# Patient Record
Sex: Female | Born: 1935 | Race: White | Hispanic: No | Marital: Single | Smoking: Former smoker
Health system: Southern US, Community
[De-identification: ages and names within clinical notes are randomized; demographics above are authoritative.]

## PROBLEM LIST (undated history)

## (undated) DIAGNOSIS — I2510 Atherosclerotic heart disease of native coronary artery without angina pectoris: Secondary | ICD-10-CM

## (undated) DIAGNOSIS — Z9889 Other specified postprocedural states: Secondary | ICD-10-CM

## (undated) DIAGNOSIS — E119 Type 2 diabetes mellitus without complications: Secondary | ICD-10-CM

## (undated) DIAGNOSIS — M519 Unspecified thoracic, thoracolumbar and lumbosacral intervertebral disc disorder: Secondary | ICD-10-CM

## (undated) DIAGNOSIS — E785 Hyperlipidemia, unspecified: Secondary | ICD-10-CM

## (undated) DIAGNOSIS — M1990 Unspecified osteoarthritis, unspecified site: Secondary | ICD-10-CM

## (undated) DIAGNOSIS — R011 Cardiac murmur, unspecified: Secondary | ICD-10-CM

## (undated) DIAGNOSIS — I10 Essential (primary) hypertension: Secondary | ICD-10-CM

## (undated) DIAGNOSIS — E039 Hypothyroidism, unspecified: Secondary | ICD-10-CM

## (undated) DIAGNOSIS — E669 Obesity, unspecified: Secondary | ICD-10-CM

## (undated) DIAGNOSIS — R0602 Shortness of breath: Secondary | ICD-10-CM

## (undated) DIAGNOSIS — I480 Paroxysmal atrial fibrillation: Secondary | ICD-10-CM

## (undated) DIAGNOSIS — G5600 Carpal tunnel syndrome, unspecified upper limb: Secondary | ICD-10-CM

## (undated) DIAGNOSIS — Z7901 Long term (current) use of anticoagulants: Secondary | ICD-10-CM

## (undated) DIAGNOSIS — I119 Hypertensive heart disease without heart failure: Secondary | ICD-10-CM

## (undated) DIAGNOSIS — I350 Nonrheumatic aortic (valve) stenosis: Secondary | ICD-10-CM

## (undated) DIAGNOSIS — Z8719 Personal history of other diseases of the digestive system: Secondary | ICD-10-CM

## (undated) HISTORY — DX: Unspecified thoracic, thoracolumbar and lumbosacral intervertebral disc disorder: M51.9

## (undated) HISTORY — DX: Nonrheumatic aortic (valve) stenosis: I35.0

## (undated) HISTORY — PX: JOINT REPLACEMENT: SHX530

## (undated) HISTORY — PX: CERVICAL FUSION: SHX112

## (undated) HISTORY — PX: CHOLECYSTECTOMY: SHX55

## (undated) HISTORY — DX: Paroxysmal atrial fibrillation: I48.0

## (undated) HISTORY — PX: TONSILLECTOMY: SUR1361

## (undated) HISTORY — PX: LUMBAR LAMINECTOMY: SHX95

## (undated) HISTORY — DX: Hypothyroidism, unspecified: E03.9

## (undated) HISTORY — PX: REPLACEMENT TOTAL KNEE: SUR1224

## (undated) HISTORY — DX: Carpal tunnel syndrome, unspecified upper limb: G56.00

## (undated) HISTORY — DX: Obesity, unspecified: E66.9

## (undated) HISTORY — DX: Hyperlipidemia, unspecified: E78.5

## (undated) HISTORY — PX: ABDOMINAL HYSTERECTOMY: SHX81

## (undated) HISTORY — PX: CARPAL TUNNEL RELEASE: SHX101

## (undated) HISTORY — DX: Shortness of breath: R06.02

## (undated) HISTORY — DX: Personal history of other diseases of the digestive system: Z87.19

## (undated) HISTORY — PX: BREAST BIOPSY: SHX20

## (undated) HISTORY — DX: Other specified postprocedural states: Z98.890

## (undated) HISTORY — PX: HERNIA REPAIR: SHX51

## (undated) HISTORY — DX: Long term (current) use of anticoagulants: Z79.01

## (undated) HISTORY — PX: EYE SURGERY: SHX253

## (undated) HISTORY — DX: Hypertensive heart disease without heart failure: I11.9

---

## 2008-05-30 ENCOUNTER — Inpatient Hospital Stay: Payer: Self-pay | Admitting: Surgery

## 2015-01-17 DIAGNOSIS — I4891 Unspecified atrial fibrillation: Secondary | ICD-10-CM | POA: Insufficient documentation

## 2017-05-18 ENCOUNTER — Encounter: Payer: Self-pay | Admitting: Cardiology

## 2017-05-18 NOTE — Progress Notes (Unsigned)
Kristina Morrison, Analilia H  Date of visit:  05/18/2017 DOB:  [birth year]    Age:  83 yrs. Medical record number:  [id number]     Account number:  [id number] Primary Care Provider: CASANOVA,LUIS ____________________________ CURRENT DIAGNOSES  1. Shortness of breath  2. Aortic valve stenosis  3. Hypertensive heart disease without heart failure  4. Paroxysmal atrial fibrillation  5. Long term (current) use of anticoagulants  6. Hyperlipidemia  7. Hypothyroidism  8. Obesity ____________________________ ALLERGIES  Penicillins, Intolerance-unknown  Sulfa (Sulfonamide Antibiotics), Intolerance-unknown ____________________________ MEDICATIONS  1. atorvastatin 20 mg tablet, 1/2 tab daily  2. carvedilol 12.5 mg tablet, BID  3. Cinnamon 500 mg capsule, BID  4. Eliquis 5 mg tablet, BID  5. Fish Oil 1,000 mg (120 mg-180 mg) capsule, BID  6. folic acid 400 mcg tablet, 1 p.o. daily  7. fosinopril 20 mg tablet, 1 p.o. daily  8. levothyroxine 50 mcg tablet, 1 p.o. daily  9. Multaq 400 mg tablet, BID  10. Multivitamins Tab, 1 p.o. daily  11. Vitamin C 500 mg tablet, BID  12. Vitamin D3 1,000 unit capsule, 1 p.o. daily ____________________________ HISTORY OF PRESENT ILLNESS  Patient is seen for evaluation of the severity of aortic stenosis and for dyspnea. The patient has a history of hypertension and diabetes. 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 She still lives independently and still drives. She has progressively limited with back pain and knee pain and has difficulty walking because of her previous back issues. She is receiving injections for her back. She had noticed issues with increasing dyspnea recently when she would walk up a hill with her driveway taking her trash cans back up. For the most part when she walks however she is limited with back pain and not with dyspnea. She finds it difficult to stand and has to walk with a cane. She does not have  angina and does not have PND, orthopnea or edema. She was seen recently by a cardiologist in [place] who told her that she needs to have her valve put in and he would give her another 10 years of life. Report of the echo reportedly showed a peak velocity of 3.7 m/s across the aortic valve. The patient does not really have much in the way of edema. She has lost about 25 pounds of weight since she was here. She has a history of paroxysmal atrial fibrillation but is in normal sinus rhythm here being treated with Multaq. She is a long-term anticoagulation with Eliquis and tolerates it well without bleeding side effects or complications. She also has recently been diagnosed with hypothyroidism and is on replacement therapy for this. Because of orthostatic hypotension a previous dose of amlodipine was stopped and she reports that she has not been checking her blood pressure since then. The dyspnea may have improved somewhat since then. ____________________________ PAST HISTORY  Past Medical Illnesses:  hypertension, DM-non-insulin dependent, hyperlipidemia, obesity, carpal tunnel syndrome, lumbar disc disease, hernia repair, hypothyroidism;  Cardiovascular Illnesses:  atrial fibrillation;  Surgical Procedures:  breast biopsy, carpal tunnel release, cholecystectomy, hysterectomy, knee replacement-bil, laminectomy lumbar, cervical spine fusion;  NYHA Classification:  II;  Canadian Angina Classification:  Class 0: Asymptomatic;  Cardiology Procedures-Invasive:  no history of prior cardiac procedures;  Cardiology Procedures-Noninvasive:  echocardiogram June 2011, lexiscan cardiolite June 2011, echocardiogram May 2015;  LVEF of 60% documented via echocardiogram on 05/18/2017,   ____________________________ CARDIO-PULMONARY TEST DATES EKG Date:  05/18/2017;  Nuclear Study Date:  12/13/2009;  Echocardiography Date: 05/18/2017  ____________________________ FAMILY HISTORY Father -- Father dead, Heart Attack Mother --  Mother dead, CVA Sister -- Sister dead, Carcinoma of breast ____________________________ SOCIAL HISTORY Alcohol Use:  rare;  Smoking:  used to smoke but quit, 1960's;  Diet:  regular diet;  Lifestyle:  widowed;  Exercise:  no regular exercise;  Occupation:  homemaker;  Illicit Drug Use:  denies substance abuse;  Residence:  lives alone;   ____________________________ REVIEW OF SYSTEMS General:  obesity, malaise and fatigue  Integumentary:no rashes or new skin lesions. Eyes: wears eye glasses/contact lenses, cataract extraction bilaterally, denies diplopia, glaucoma or visual field defects. Ears, Nose, Throat, Mouth:  partial hearing loss, hearing aides Respiratory: dyspnea with exertion Cardiovascular:  please review HPI Abdominal: occasional diarrheaGenitourinary-Female: nocturia Musculoskeletal:  arthritis of the knees, chronic back pain Neurological:  denies headaches, stroke, or TIA Psychiatric:  denies depression or anxiety Hematological/Immunologic:  denies any food allergies, bleeding disorders. ____________________________ PHYSICAL EXAMINATION VITAL SIGNS  Blood Pressure:  160/84 Sitting, Left arm, large cuff  , 170/82 Standing, Left arm and large cuff   Pulse:  60/min. Weight:  196.00 lbs. Height:  64"BMI: 33  Constitutional:  pleasant white female, in no acute distress, moderately obese Skin:  warm and dry to touch, no apparent skin lesions, or masses noted. Head:  normocephalic, normal hair pattern, no masses or tenderness Eyes:  EOMS Intact, PERRLA, C and S clear, Funduscopic exam not done. ENT:  bilateral hearing aides present Neck:  supple, without massess. No JVD, thyromegaly or carotid bruits. Carotid upstroke normal. Chest:  normal symmetry, clear to auscultation. Cardiac:  regular rhythm, normal S1 and S2, no S3 or S4, grade 2/6 systolic murmur at aortic area radiating to neck Abdomen:  abdomen soft,non-tender, no masses, no hepatospenomegaly, or aneurysm noted Peripheral  Pulses:  the femoral,dorsalis pedis, and posterior tibial pulses are full and equal bilaterally with no bruits auscultated. Extremities & Back:  scar from knee surgery bilaterally, no spinal abnormalities noted., no edema present Psychiatric:  oriented to time, place, and person, no difficulties with speech or language Neurological:  no gross motor or sensory deficits noted, affect appropriate, oriented x3. ____________________________ MOST RECENT LIPID PANEL 09/15/13  CHOL TOTL 126 mg/dl, LDL 51 NM, HDL 58 mg/dl, TRIGLYCER 86 mg/dl and CHOL/HDL 2.2 (Calc) ____________________________ IMPRESSIONS/PLAN  1. Moderate to severe aortic stenosis with minimal symptoms 2. Severe low back pain and osteoarthritis 3. Hypertensive heart disease currently above goal 4. Obesity with 25 pound weight loss 5. Paroxysmal atrial fibrillation currently in sinus rhythm 6. Long-term use of anticoagulation without complications 7. Diabetes mellitus without complications  Recommendations:  Long discussion with patient and daughter about aortic stenosis. After careful history her limiting symptoms there appeared to be more back and knee pain rather than dyspnea. Although she does have some dyspnea her aortic valve area is only moderate to severe and her peak gradient in R. lab was 2.7 m/s and valve opening was still present on the visual images. She has had some intermittent dyspnea through the years with much lesser gradients. 12-lead EKG personally reviewed by me shows sinus bradycardia with left axis deviation, QS pattern in V2. I reviewed old records from her previous physician and her old chart here.  At this point in time I believe her limiting symptoms are more orthopedic than cardiac. She is quite happy with her quality of life and is 83 years old. Her aortic annulus appears to be somewhat on the  small side and unless she was more limited with dyspnea I would favor continued observation at this point with a  followup echo in 6 months. If she was highly symptomatic in terms of dyspnea that I believe it would be reasonable to pursue TAVR.  I will discuss the case and have the echo reviewed by Dr. Tonny BollmanMichael Cooper. At this point in time I would recommend that she monitor her blood pressures at home and restart the amlodipine if the pressure remains high. Followup echo in 6 months.   Greater than 50 minutes spent with patient as well as her daughter including greater than 50% face to face including counseling/coordination of care, consultation with other physicians as well as review of previous records and echo concerning aortic stenosis, exercise intolerance, hypertensive heart disease and paroxysmal atrial fibrillation. ____________________________ TODAYS ORDERS  1. 12 Lead EKG: Today  2. ECHO 6 months.                        ____________________________ Cardiology Physician:  Darden PalmerW. Spencer Breean Nannini, Jr. MD [facility]

## 2017-05-20 ENCOUNTER — Institutional Professional Consult (permissible substitution): Payer: Self-pay | Admitting: Cardiovascular Disease

## 2017-08-05 ENCOUNTER — Encounter: Payer: Self-pay | Admitting: Cardiovascular Disease

## 2017-08-05 ENCOUNTER — Other Ambulatory Visit: Payer: Self-pay

## 2017-08-05 DIAGNOSIS — I350 Nonrheumatic aortic (valve) stenosis: Secondary | ICD-10-CM

## 2017-08-05 NOTE — H&P (View-Only) (Signed)
Cardiology Office Note Date:  08/06/2017   ID:  Kristina Morrison, DOB 01/24/1925, MRN [id number]  PCP:  Marsa Arisasanova, Luis  Cardiologist:  Viann FishSpencer Tilley, MD  Chief Complaint  Patient presents with  . Shortness of Breath     History of Present Illness: Kristina Morrison is a 84 y.o. female who presents for evaluation of aortic stenosis at the request of Dr Donnie Ahoilley.  The patient lives in [place] and is the mother of Dr York Spanielilley's nurse. She has been functionally independent. The patient has been followed for paroxysmal atrial fibrillation and aortic stenosis. She is managed with a rhythm control strategy using multaq and is anticoagulated with apixaban.   The patient is here with her daughter today. She has been told of a heart murmur for about 40 years. When she was seen by her cardiologist in [place] this past Fall, he recommend that she consider TAVR because she had complained of shortness of breath with walking up her driveway. Since her daughter has worked with Dr Donnie Ahoilley, she [facility] her [facility] [place]. Because she was primarily limited by osteoarthritis at that point, he recommended continued surveillance. Since then, she has returned to [place] and has noted progressive shortness of breath, now occurring with even low-level activity such as walking on flat ground or moving around in the house. She also complains of a fullness in her left chest with activity. She is independent but significantly limited by severe osteoarthritis in her knees and back. She has undergone bilateral total knee replacement. No orthopnea or PND. No leg swelling. Also complains of a 'cold sweat' when she is active.  The patient also complains of lightheadedness.  She had blood in her stool recently and underwent colonoscopy and this was felt secondary to diverticulosis. This has now resolved. She has been treated with apixaban and she does not take aspirin products.   Past Medical History:  Diagnosis  Date  . Aortic valve stenosis   . Carpal tunnel syndrome   . H/O hernia repair   . Hyperlipidemia   . Hypertensive heart disease without heart failure   . Hypothyroidism   . Long term (current) use of anticoagulants   . Lumbar disc disease   . Obesity   . Paroxysmal atrial fibrillation (HCC)   . SOB (shortness of breath)     Past Surgical History:  Procedure Laterality Date  . ABDOMINAL HYSTERECTOMY    . BREAST BIOPSY    . CARPAL TUNNEL RELEASE    . CERVICAL FUSION    . CHOLECYSTECTOMY    . LUMBAR LAMINECTOMY    . REPLACEMENT TOTAL KNEE Bilateral     Current Outpatient Medications  Medication Sig Dispense Refill  . apixaban (ELIQUIS) 5 MG TABS tablet Take 5 mg by mouth 2 (two) times daily.    Marland Kitchen. atorvastatin (LIPITOR) 20 MG tablet Take 20 mg by mouth daily.     . carvedilol (COREG) 25 MG tablet Take 25 mg by mouth daily.     . Cholecalciferol (VITAMIN D3) 1000 units CAPS Take 1 capsule by mouth daily.    . Cinnamon 500 MG capsule Take 500 mg by mouth 2 (two) times daily.    Marland Kitchen. dronedarone (MULTAQ) 400 MG tablet Take 400 mg by mouth 2 (two) times daily with a meal.    . famotidine (PEPCID) 20 MG tablet Take 20 mg by mouth daily.    . folic acid (FOLVITE) 400 MCG tablet Take 400 mcg by mouth daily.    .Marland Kitchen  fosinopril (MONOPRIL) 20 MG tablet Take 20 mg by mouth daily.    Marland Kitchen glimepiride (AMARYL) 1 MG tablet Take 1 tablet by mouth daily.  0  . levothyroxine (SYNTHROID, LEVOTHROID) 50 MCG tablet Take 50 mcg by mouth daily before breakfast.    . Multiple Vitamin (MULTIVITAMIN) tablet Take 1 tablet by mouth daily.    . Omega-3 Fatty Acids (FISH OIL) 1000 MG CAPS Take 1 capsule by mouth 2 (two) times daily.    . predniSONE (DELTASONE) 10 MG tablet Take 0.5 tablets by mouth daily.  2  . vitamin C (ASCORBIC ACID) 500 MG tablet Take 500 mg by mouth 2 (two) times daily.     No current facility-administered medications for this visit.     Allergies:   Other; Penicillins; and Sulfa  antibiotics   Social History:  The patient  reports that she quit smoking about 59 years ago. she has never used smokeless tobacco. She reports that she drinks alcohol. She reports that she does not use drugs.   Family History:  The patient's family history includes Breast cancer in her sister; CVA in her mother; Heart attack in her father.    ROS:  Please see the history of present illness.  Otherwise, review of systems is positive for hearing loss, easy bruising, hearing loss.  All other systems are reviewed and negative.    PHYSICAL EXAM: VS:  BP 126/78   Pulse (!) 59   Ht 5\' 5"  (1.651 m)   Wt 192 lb 9.6 oz (87.4 kg)   SpO2 98%   BMI 32.05 kg/m  , BMI Body mass index is 32.05 kg/m. GEN: Well nourished, well developed, pleasant elderly woman in no acute distress  HEENT: normal  Neck: no JVD, no masses. bilateral carotid bruits Cardiac: RRR with 3/6 late peaking harsh crescendo decrescendo murmur at the right upper sternal border, A2 is audible but diminished.               Respiratory:  clear to auscultation bilaterally, normal work of breathing GI: soft, nontender, nondistended, + BS MS: no deformity or atrophy  Ext: trace bilateral pretibial edema, pedal pulses 2+= bilaterally Skin: warm and dry, no rash Neuro:  Strength and sensation are intact Psych: euthymic mood, full affect  EKG:  EKG is ordered today. EKG demonstrates sinus rhythm 59 bpm, first-degree AV block with PR interval 220 ms, left anterior fascicular block. Recent Labs: No results found for requested labs within last 8760 hours.   Lipid Panel  No results found for: CHOL, TRIG, HDL, CHOLHDL, VLDL, LDLCALC, LDLDIRECT    Wt Readings from Last 3 Encounters:  08/06/17 192 lb 9.6 oz (87.4 kg)     Cardiac Studies Reviewed: Echo pending  ASSESSMENT AND PLAN: 84 year old woman with suspected severe, stage D1, symptomatic aortic stenosis.  The patient's physical exam and symptoms are very typical of  symptomatic aortic stenosis.  By report, the peak transaortic velocity has been in the range of 3.7 m/s.  A subsequent echo study in November demonstrated a peak velocity of 2.7 m/s.  With such typical symptoms and exam findings, will update her echocardiogram as I think she may have progressive and now severe aortic stenosis.  I have reviewed the natural history of aortic stenosis with the patient and her daughter who is present today. We have discussed the limitations of medical therapy and the poor prognosis associated with symptomatic aortic stenosis. We have reviewed potential treatment options, including palliative medical therapy, conventional surgical aortic  valve replacement, and transcatheter aortic valve replacement. We discussed treatment options in the context of the patient's specific comorbid medical conditions.   We discussed the typical evaluation of treatment options specific to TAVR considering this patient's advanced age of 84 years old.  I have recommended an updated surface echocardiogram, a right and left heart catheterization, and pending the catheterization findings I would anticipate CTA studies of the heart and peripheral vessels.  Would then likely proceed with formal cardiac surgical consultation.  The patient will be advised to hold apixaban for 48 hours prior to cardiac catheterization. I have reviewed the risks, indications, and alternatives to cardiac catheterization, possible angioplasty, and stenting with the patient. Risks include but are not limited to bleeding, infection, vascular injury, stroke, myocardial infection, arrhythmia, kidney injury, radiation-related injury in the case of prolonged fluoroscopy use, emergency cardiac surgery, and death. The patient understands the risks of serious complication is 1-2 in 1000 with diagnostic cardiac cath and 1-2% or less with angioplasty/stenting.   Current medicines are reviewed with the patient today.  The patient does not  have concerns regarding medicines.  Labs/ tests ordered today include:  No orders of the defined types were placed in this encounter.    Signed, Tonny Bollman, MD  08/06/2017 11:45 AM    [facility] [address] [place], [place], [place]  [address] Phone: [phone]; Fax: [phone]

## 2017-08-05 NOTE — H&P (View-Only) (Signed)
Cardiology Office Note Date:  08/06/2017   ID:  Kristina FlurryBetty H Kincy, DOB 01/24/1925, MRN [id number]  PCP:  Marsa Arisasanova, Luis  Cardiologist:  Viann FishSpencer Tilley, MD  Chief Complaint  Patient presents with  . Shortness of Breath     History of Present Illness: Kristina Morrison is a 84 y.o. female who presents for evaluation of aortic stenosis at the request of Dr Donnie Ahoilley.  The patient lives in [place] and is the mother of Dr York Spanielilley's nurse. She has been functionally independent. The patient has been followed for paroxysmal atrial fibrillation and aortic stenosis. She is managed with a rhythm control strategy using multaq and is anticoagulated with apixaban.   The patient is here with her daughter today. She has been told of a heart murmur for about 40 years. When she was seen by her cardiologist in [place] this past Fall, he recommend that she consider TAVR because she had complained of shortness of breath with walking up her driveway. Since her daughter has worked with Dr Donnie Ahoilley, she had her mom come [facility]. Because she was primarily limited by osteoarthritis at that point, he recommended continued surveillance. Since then, she has returned to [place] and has noted progressive shortness of breath, now occurring with even low-level activity such as walking on flat ground or moving around in the house. She also complains of a fullness in her left chest with activity. She is independent but significantly limited by severe osteoarthritis in her knees and back. She has undergone bilateral total knee replacement. No orthopnea or PND. No leg swelling. Also complains of a 'cold sweat' when she is active.  The patient also complains of lightheadedness.  She had blood in her stool recently and underwent colonoscopy and this was felt secondary to diverticulosis. This has now resolved. She has been treated with apixaban and she does not take aspirin products.   Past Medical History:  Diagnosis  Date  . Aortic valve stenosis   . Carpal tunnel syndrome   . H/O hernia repair   . Hyperlipidemia   . Hypertensive heart disease without heart failure   . Hypothyroidism   . Long term (current) use of anticoagulants   . Lumbar disc disease   . Obesity   . Paroxysmal atrial fibrillation (HCC)   . SOB (shortness of breath)     Past Surgical History:  Procedure Laterality Date  . ABDOMINAL HYSTERECTOMY    . BREAST BIOPSY    . CARPAL TUNNEL RELEASE    . CERVICAL FUSION    . CHOLECYSTECTOMY    . LUMBAR LAMINECTOMY    . REPLACEMENT TOTAL KNEE Bilateral     Current Outpatient Medications  Medication Sig Dispense Refill  . apixaban (ELIQUIS) 5 MG TABS tablet Take 5 mg by mouth 2 (two) times daily.    Marland Kitchen. atorvastatin (LIPITOR) 20 MG tablet Take 20 mg by mouth daily.     . carvedilol (COREG) 25 MG tablet Take 25 mg by mouth daily.     . Cholecalciferol (VITAMIN D3) 1000 units CAPS Take 1 capsule by mouth daily.    . Cinnamon 500 MG capsule Take 500 mg by mouth 2 (two) times daily.    Marland Kitchen. dronedarone (MULTAQ) 400 MG tablet Take 400 mg by mouth 2 (two) times daily with a meal.    . famotidine (PEPCID) 20 MG tablet Take 20 mg by mouth daily.    . folic acid (FOLVITE) 400 MCG tablet Take 400 mcg by mouth daily.    .Marland Kitchen  fosinopril (MONOPRIL) 20 MG tablet Take 20 mg by mouth daily.    Marland Kitchen glimepiride (AMARYL) 1 MG tablet Take 1 tablet by mouth daily.  0  . levothyroxine (SYNTHROID, LEVOTHROID) 50 MCG tablet Take 50 mcg by mouth daily before breakfast.    . Multiple Vitamin (MULTIVITAMIN) tablet Take 1 tablet by mouth daily.    . Omega-3 Fatty Acids (FISH OIL) 1000 MG CAPS Take 1 capsule by mouth 2 (two) times daily.    . predniSONE (DELTASONE) 10 MG tablet Take 0.5 tablets by mouth daily.  2  . vitamin C (ASCORBIC ACID) 500 MG tablet Take 500 mg by mouth 2 (two) times daily.     No current facility-administered medications for this visit.     Allergies:   Other; Penicillins; and Sulfa  antibiotics   Social History:  The patient  reports that she quit smoking about 59 years ago. she has never used smokeless tobacco. She reports that she drinks alcohol. She reports that she does not use drugs.   Family History:  The patient's family history includes Breast cancer in her sister; CVA in her mother; Heart attack in her father.    ROS:  Please see the history of present illness.  Otherwise, review of systems is positive for hearing loss, easy bruising, hearing loss.  All other systems are reviewed and negative.    PHYSICAL EXAM: VS:  BP 126/78   Pulse (!) 59   Ht 5\' 5"  (1.651 m)   Wt 192 lb 9.6 oz (87.4 kg)   SpO2 98%   BMI 32.05 kg/m  , BMI Body mass index is 32.05 kg/m. GEN: Well nourished, well developed, pleasant elderly woman in no acute distress  HEENT: normal  Neck: no JVD, no masses. bilateral carotid bruits Cardiac: RRR with 3/6 late peaking harsh crescendo decrescendo murmur at the right upper sternal border, A2 is audible but diminished.               Respiratory:  clear to auscultation bilaterally, normal work of breathing GI: soft, nontender, nondistended, + BS MS: no deformity or atrophy  Ext: trace bilateral pretibial edema, pedal pulses 2+= bilaterally Skin: warm and dry, no rash Neuro:  Strength and sensation are intact Psych: euthymic mood, full affect  EKG:  EKG is ordered today. EKG demonstrates sinus rhythm 59 bpm, first-degree AV block with PR interval 220 ms, left anterior fascicular block. Recent Labs: No results found for requested labs within last 8760 hours.   Lipid Panel  No results found for: CHOL, TRIG, HDL, CHOLHDL, VLDL, LDLCALC, LDLDIRECT    Wt Readings from Last 3 Encounters:  08/06/17 192 lb 9.6 oz (87.4 kg)     Cardiac Studies Reviewed: Echo pending  ASSESSMENT AND PLAN: 84 year old woman with suspected severe, stage D1, symptomatic aortic stenosis.  The patient's physical exam and symptoms are very typical of  symptomatic aortic stenosis.  By report, the peak transaortic velocity has been in the range of 3.7 m/s.  A subsequent echo study in November demonstrated a peak velocity of 2.7 m/s.  With such typical symptoms and exam findings, will update her echocardiogram as I think she may have progressive and now severe aortic stenosis.  I have reviewed the natural history of aortic stenosis with the patient and her daughter who is present today. We have discussed the limitations of medical therapy and the poor prognosis associated with symptomatic aortic stenosis. We have reviewed potential treatment options, including palliative medical therapy, conventional surgical aortic  valve replacement, and transcatheter aortic valve replacement. We discussed treatment options in the context of the patient's specific comorbid medical conditions.   We discussed the typical evaluation of treatment options specific to TAVR considering this patient's advanced age of 84 years old.  I have recommended an updated surface echocardiogram, a right and left heart catheterization, and pending the catheterization findings I would anticipate CTA studies of the heart and peripheral vessels.  Would then likely proceed with formal cardiac surgical consultation.  The patient will be advised to hold apixaban for 48 hours prior to cardiac catheterization. I have reviewed the risks, indications, and alternatives to cardiac catheterization, possible angioplasty, and stenting with the patient. Risks include but are not limited to bleeding, infection, vascular injury, stroke, myocardial infection, arrhythmia, kidney injury, radiation-related injury in the case of prolonged fluoroscopy use, emergency cardiac surgery, and death. The patient understands the risks of serious complication is 1-2 in 1000 with diagnostic cardiac cath and 1-2% or less with angioplasty/stenting.   Current medicines are reviewed with the patient today.  The patient does not  have concerns regarding medicines.  Labs/ tests ordered today include:  No orders of the defined types were placed in this encounter.    Signed, Tonny Bollman, MD  08/06/2017 11:45 AM    [facility] [address] [place], [place], [place]  [address] Phone: [phone]; Fax: [phone]

## 2017-08-05 NOTE — Progress Notes (Signed)
Cardiology Office Note Date:  08/06/2017   ID:  Kristina Morrison, DOB 01/24/1925, MRN [id number]  PCP:  Kristina Morrison  Cardiologist:  Kristina FishSpencer Tilley, MD  Chief Complaint  Patient presents with  . Shortness of Breath     History of Present Illness: Kristina Morrison is a 84 y.o. female who presents for evaluation of aortic stenosis at the request of Dr Donnie Ahoilley.  The patient lives in [place] and is the mother of Dr York Spanielilley's nurse. She has been functionally independent. The patient has been followed for paroxysmal atrial fibrillation and aortic stenosis. She is managed with a rhythm control strategy using multaq and is anticoagulated with apixaban.   The patient is here with her daughter today. She has been told of a heart murmur for about 40 years. When she was seen by her cardiologist in [place] this past Fall, he recommend that she consider TAVR because she had complained of shortness of breath with walking up her driveway. Since her daughter has worked with Dr Donnie Ahoilley, she had her mom come to [facility] for evaluation. Because she was primarily limited by osteoarthritis at that point, he recommended continued surveillance. Since then, she has returned to [place] and has noted progressive shortness of breath, now occurring with even low-level activity such as walking on flat ground or moving around in the house. She also complains of a fullness in her left chest with activity. She is independent but significantly limited by severe osteoarthritis in her knees and back. She has undergone bilateral total knee replacement. No orthopnea or PND. No leg swelling. Also complains of a 'cold sweat' when she is active.  The patient also complains of lightheadedness.  She had blood in her stool recently and underwent colonoscopy and this was felt secondary to diverticulosis. This has now resolved. She has been treated with apixaban and she does not take aspirin products.   Past Medical History:  Diagnosis  Date  . Aortic valve stenosis   . Carpal tunnel syndrome   . H/O hernia repair   . Hyperlipidemia   . Hypertensive heart disease without heart failure   . Hypothyroidism   . Long term (current) use of anticoagulants   . Lumbar disc disease   . Obesity   . Paroxysmal atrial fibrillation (HCC)   . SOB (shortness of breath)     Past Surgical History:  Procedure Laterality Date  . ABDOMINAL HYSTERECTOMY    . BREAST BIOPSY    . CARPAL TUNNEL RELEASE    . CERVICAL FUSION    . CHOLECYSTECTOMY    . LUMBAR LAMINECTOMY    . REPLACEMENT TOTAL KNEE Bilateral     Current Outpatient Medications  Medication Sig Dispense Refill  . apixaban (ELIQUIS) 5 MG TABS tablet Take 5 mg by mouth 2 (two) times daily.    Marland Kitchen. atorvastatin (LIPITOR) 20 MG tablet Take 20 mg by mouth daily.     . carvedilol (COREG) 25 MG tablet Take 25 mg by mouth daily.     . Cholecalciferol (VITAMIN D3) 1000 units CAPS Take 1 capsule by mouth daily.    . Cinnamon 500 MG capsule Take 500 mg by mouth 2 (two) times daily.    Marland Kitchen. dronedarone (MULTAQ) 400 MG tablet Take 400 mg by mouth 2 (two) times daily with a meal.    . famotidine (PEPCID) 20 MG tablet Take 20 mg by mouth daily.    . folic acid (FOLVITE) 400 MCG tablet Take 400 mcg by mouth daily.    .Marland Kitchen  fosinopril (MONOPRIL) 20 MG tablet Take 20 mg by mouth daily.    Marland Kitchen glimepiride (AMARYL) 1 MG tablet Take 1 tablet by mouth daily.  0  . levothyroxine (SYNTHROID, LEVOTHROID) 50 MCG tablet Take 50 mcg by mouth daily before breakfast.    . Multiple Vitamin (MULTIVITAMIN) tablet Take 1 tablet by mouth daily.    . Omega-3 Fatty Acids (FISH OIL) 1000 MG CAPS Take 1 capsule by mouth 2 (two) times daily.    . predniSONE (DELTASONE) 10 MG tablet Take 0.5 tablets by mouth daily.  2  . vitamin C (ASCORBIC ACID) 500 MG tablet Take 500 mg by mouth 2 (two) times daily.     No current facility-administered medications for this visit.     Allergies:   Other; Penicillins; and Sulfa  antibiotics   Social History:  The patient  reports that she quit smoking about 59 years ago. she has never used smokeless tobacco. She reports that she drinks alcohol. She reports that she does not use drugs.   Family History:  The patient's family history includes Breast cancer in her sister; CVA in her mother; Heart attack in her father.    ROS:  Please see the history of present illness.  Otherwise, review of systems is positive for hearing loss, easy bruising, hearing loss.  All other systems are reviewed and negative.    PHYSICAL EXAM: VS:  BP 126/78   Pulse (!) 59   Ht 5\' 5"  (1.651 m)   Wt 192 lb 9.6 oz (87.4 kg)   SpO2 98%   BMI 32.05 kg/m  , BMI Body mass index is 32.05 kg/m. GEN: Well nourished, well developed, pleasant elderly woman in no acute distress  HEENT: normal  Neck: no JVD, no masses. bilateral carotid bruits Cardiac: RRR with 3/6 late peaking harsh crescendo decrescendo murmur at the right upper sternal border, A2 is audible but diminished.               Respiratory:  clear to auscultation bilaterally, normal work of breathing GI: soft, nontender, nondistended, + BS MS: no deformity or atrophy  Ext: trace bilateral pretibial edema, pedal pulses 2+= bilaterally Skin: warm and dry, no rash Neuro:  Strength and sensation are intact Psych: euthymic mood, full affect  EKG:  EKG is ordered today. EKG demonstrates sinus rhythm 59 bpm, first-degree AV block with PR interval 220 ms, left anterior fascicular block. Recent Labs: No results found for requested labs within last 8760 hours.   Lipid Panel  No results found for: CHOL, TRIG, HDL, CHOLHDL, VLDL, LDLCALC, LDLDIRECT    Wt Readings from Last 3 Encounters:  08/06/17 192 lb 9.6 oz (87.4 kg)     Cardiac Studies Reviewed: Echo pending  ASSESSMENT AND PLAN: 84 year old woman with suspected severe, stage D1, symptomatic aortic stenosis.  The patient's physical exam and symptoms are very typical of  symptomatic aortic stenosis.  By report, the peak transaortic velocity has been in the range of 3.7 m/s.  A subsequent echo study in November demonstrated a peak velocity of 2.7 m/s.  With such typical symptoms and exam findings, will update her echocardiogram as I think she may have progressive and now severe aortic stenosis.  I have reviewed the natural history of aortic stenosis with the patient and her daughter who is present today. We have discussed the limitations of medical therapy and the poor prognosis associated with symptomatic aortic stenosis. We have reviewed potential treatment options, including palliative medical therapy, conventional surgical aortic  valve replacement, and transcatheter aortic valve replacement. We discussed treatment options in the context of the patient's specific comorbid medical conditions.   We discussed the typical evaluation of treatment options specific to TAVR considering this patient's advanced age of 84 years old.  I have recommended an updated surface echocardiogram, a right and left heart catheterization, and pending the catheterization findings I would anticipate CTA studies of the heart and peripheral vessels.  Would then likely proceed with formal cardiac surgical consultation.  The patient will be advised to hold apixaban for 48 hours prior to cardiac catheterization. I have reviewed the risks, indications, and alternatives to cardiac catheterization, possible angioplasty, and stenting with the patient. Risks include but are not limited to bleeding, infection, vascular injury, stroke, myocardial infection, arrhythmia, kidney injury, radiation-related injury in the case of prolonged fluoroscopy use, emergency cardiac surgery, and death. The patient understands the risks of serious complication is 1-2 in 1000 with diagnostic cardiac cath and 1-2% or less with angioplasty/stenting.   Current medicines are reviewed with the patient today.  The patient does not  have concerns regarding medicines.  Labs/ tests ordered today include:  No orders of the defined types were placed in this encounter.    Signed, Tonny Bollman, MD  08/06/2017 11:45 AM    [facility] [address] [place], [place], [place]  [address] Phone: [phone]; Fax: [phone]

## 2017-08-06 ENCOUNTER — Encounter: Payer: Self-pay | Admitting: Cardiovascular Disease

## 2017-08-06 ENCOUNTER — Ambulatory Visit (HOSPITAL_COMMUNITY): Payer: Medicare Other | Attending: Cardiovascular Disease

## 2017-08-06 ENCOUNTER — Other Ambulatory Visit: Payer: Self-pay

## 2017-08-06 ENCOUNTER — Encounter (INDEPENDENT_AMBULATORY_CARE_PROVIDER_SITE_OTHER): Payer: Self-pay

## 2017-08-06 ENCOUNTER — Institutional Professional Consult (permissible substitution): Payer: Self-pay | Admitting: Cardiovascular Disease

## 2017-08-06 ENCOUNTER — Ambulatory Visit (INDEPENDENT_AMBULATORY_CARE_PROVIDER_SITE_OTHER): Payer: Medicare Other | Admitting: Cardiovascular Disease

## 2017-08-06 VITALS — BP 126/78 | HR 59 | Ht 65.0 in | Wt 192.6 lb

## 2017-08-06 DIAGNOSIS — I10 Essential (primary) hypertension: Secondary | ICD-10-CM | POA: Diagnosis not present

## 2017-08-06 DIAGNOSIS — E785 Hyperlipidemia, unspecified: Secondary | ICD-10-CM | POA: Insufficient documentation

## 2017-08-06 DIAGNOSIS — I350 Nonrheumatic aortic (valve) stenosis: Secondary | ICD-10-CM

## 2017-08-06 DIAGNOSIS — Z87891 Personal history of nicotine dependence: Secondary | ICD-10-CM | POA: Diagnosis not present

## 2017-08-06 DIAGNOSIS — Z8249 Family history of ischemic heart disease and other diseases of the circulatory system: Secondary | ICD-10-CM | POA: Diagnosis not present

## 2017-08-06 DIAGNOSIS — I080 Rheumatic disorders of both mitral and aortic valves: Secondary | ICD-10-CM | POA: Insufficient documentation

## 2017-08-06 DIAGNOSIS — R0602 Shortness of breath: Secondary | ICD-10-CM

## 2017-08-06 DIAGNOSIS — I4891 Unspecified atrial fibrillation: Secondary | ICD-10-CM | POA: Diagnosis not present

## 2017-08-06 DIAGNOSIS — R0600 Dyspnea, unspecified: Secondary | ICD-10-CM | POA: Insufficient documentation

## 2017-08-06 LAB — ECHOCARDIOGRAM COMPLETE
HEIGHTINCHES: 65 in
Weight: 3081.6 oz

## 2017-08-06 NOTE — Patient Instructions (Signed)
Medication Instructions:  Your physician recommends that you continue on your current medications as directed. Please refer to the Current Medication list given to you today.  Labwork: Your physician recommends that you have lab work today: BMP, CBC and PT/INR  Testing/Procedures: Your physician has requested that you have a cardiac catheterization. Cardiac catheterization is used to diagnose and/or treat various heart conditions. Doctors may recommend this procedure for a number of different reasons. The most common reason is to evaluate chest pain. Chest pain can be a symptom of coronary artery disease (CAD), and cardiac catheterization can show whether plaque is narrowing or blocking your heart's arteries. This procedure is also used to evaluate the valves, as well as measure the blood flow and oxygen levels in different parts of your heart. For further information please visit [email or web address] Please follow instruction sheet, as given.    [facility] [facility] [address], [address] [place] [address] Dept: [phone] Loc: [phone]  Kristina FlurryBetty H Morrison  08/06/2017  You are scheduled for a Cardiac Catheterization on Tuesday, February 12 with Dr. Tonny BollmanMichael Morrison.  1. Please arrive at the [facility] ([facility] A[facility] at [facility] [place]: [address] [place], [place] [address] at 6:00 AM (two hours before your procedure to ensure your preparation). Free valet parking service is available.   Special note: Every effort is made to have your procedure done on time. Please understand that emergencies sometimes delay scheduled procedures.  2. Diet: Do not eat or drink anything after midnight prior to your procedure except sips of water to take medications.  3. Labs: None needed.  4. Medication instructions in preparation for your procedure:  Stop taking Eliquis (Apixiban) on Sunday, February 10. You  will take your last dosage of Eliquis on Saturday night.  Do not take Glimepiride the morning of procedure.   Please take one Aspirin 81mg  the morning of procedure before coming to the hospital.   On the morning of your procedure, take your Aspirin and any morning medicines NOT listed above.  You may use sips of water.  5. Plan for one night stay--bring personal belongings.  6. Bring a current list of your medications and current insurance cards.  7. You MUST have a responsible person to drive you home.  8. Someone MUST be with you the first 24 hours after you arrive home or your discharge will be delayed.  9. Please wear clothes that are easy to get on and off and wear slip-on shoes.  Thank you for allowing us to care for you!   -- [facility]   Follow-Up: We will arranged additional TAVR evaluation after cardiac catheterization.   Any Other Special Instructions Will Be Listed Below (If Applicable).     If you need a refill on your cardiac medications before your next appointment, please call your pharmacy.

## 2017-08-07 LAB — BASIC METABOLIC PANEL
BUN/Creatinine Ratio: 21 (ref 12–28)
BUN: 24 mg/dL (ref 10–36)
CO2: 23 mmol/L (ref 20–29)
Calcium: 9.7 mg/dL (ref 8.7–10.3)
Chloride: 104 mmol/L (ref 96–106)
Creatinine, Ser: 1.14 mg/dL — ABNORMAL HIGH (ref 0.57–1.00)
GFR calc non Af Amer: 42 mL/min/{1.73_m2} — ABNORMAL LOW (ref >59)
GFR, EST AFRICAN AMERICAN: 49 mL/min/{1.73_m2} — AB (ref >59)
Glucose: 197 mg/dL — ABNORMAL HIGH (ref 65–99)
Potassium: 4.7 mmol/L (ref 3.5–5.2)
Sodium: 144 mmol/L (ref 134–144)

## 2017-08-07 LAB — PROTIME-INR
INR: 1.1 (ref 0.8–1.2)
Prothrombin Time: 11.2 s (ref 9.1–12.0)

## 2017-08-07 LAB — CBC
HEMATOCRIT: 34.1 % (ref 34.0–46.6)
Hemoglobin: 11 g/dL — ABNORMAL LOW (ref 11.1–15.9)
MCH: 29.7 pg (ref 26.6–33.0)
MCHC: 32.3 g/dL (ref 31.5–35.7)
MCV: 92 fL (ref 79–97)
Platelets: 242 10*3/uL (ref 150–379)
RBC: 3.7 x10E6/uL — AB (ref 3.77–5.28)
RDW: 14.5 % (ref 12.3–15.4)
WBC: 8.9 10*3/uL (ref 3.4–10.8)

## 2017-08-10 ENCOUNTER — Telehealth: Payer: Self-pay | Admitting: *Deleted

## 2017-08-10 NOTE — Telephone Encounter (Signed)
LM with detailed instructions at [phone] for [name](DPR), left this nurse name and phone number to call back if additional questions or concerns.

## 2017-08-10 NOTE — Telephone Encounter (Signed)
[facility]  for: Tuesday February 12,2019 8 AM Verify arrival time and place: [facility] Main [facility]tr[facility] A[facility] Tower at: 6 AM  Verify HOLD:  Eliquis-- Stop taking Eliquis (Apixiban) on Sunday, February 10. You will take your last dosage of Eliquis on Saturday night.  Glimepiride none AM of cath  Verify AM meds can be  taken pre-cath with sip of water including:  ASA 81 mg am of cath  Confirm patient has responsible person to drive home post procedure and observe patient for 24 hours  LMTCB for pt to discuss

## 2017-08-11 ENCOUNTER — Encounter (HOSPITAL_COMMUNITY): Payer: Self-pay | Admitting: Cardiovascular Disease

## 2017-08-11 ENCOUNTER — Ambulatory Visit (HOSPITAL_COMMUNITY)
Admission: RE | Disposition: A | Discharge status: Home / Self Care | Payer: Self-pay | Source: Ambulatory Visit | Attending: Cardiovascular Disease

## 2017-08-11 ENCOUNTER — Other Ambulatory Visit: Payer: Self-pay

## 2017-08-11 ENCOUNTER — Ambulatory Visit (HOSPITAL_COMMUNITY)
Admission: RE | Admit: 2017-08-11 | Discharge: 2017-08-11 | Disposition: A | Discharge status: Home / Self Care | Payer: Medicare Other | Source: Ambulatory Visit | Attending: Cardiovascular Disease | Admitting: Cardiovascular Disease

## 2017-08-11 DIAGNOSIS — I480 Paroxysmal atrial fibrillation: Secondary | ICD-10-CM | POA: Insufficient documentation

## 2017-08-11 DIAGNOSIS — I2584 Coronary atherosclerosis due to calcified coronary lesion: Secondary | ICD-10-CM | POA: Diagnosis not present

## 2017-08-11 DIAGNOSIS — Z7901 Long term (current) use of anticoagulants: Secondary | ICD-10-CM | POA: Diagnosis not present

## 2017-08-11 DIAGNOSIS — I350 Nonrheumatic aortic (valve) stenosis: Secondary | ICD-10-CM | POA: Diagnosis present

## 2017-08-11 DIAGNOSIS — Z880 Allergy status to penicillin: Secondary | ICD-10-CM | POA: Diagnosis not present

## 2017-08-11 DIAGNOSIS — E785 Hyperlipidemia, unspecified: Secondary | ICD-10-CM | POA: Diagnosis not present

## 2017-08-11 DIAGNOSIS — Z7952 Long term (current) use of systemic steroids: Secondary | ICD-10-CM | POA: Insufficient documentation

## 2017-08-11 DIAGNOSIS — I2510 Atherosclerotic heart disease of native coronary artery without angina pectoris: Secondary | ICD-10-CM | POA: Diagnosis not present

## 2017-08-11 DIAGNOSIS — Z87891 Personal history of nicotine dependence: Secondary | ICD-10-CM | POA: Diagnosis not present

## 2017-08-11 DIAGNOSIS — E669 Obesity, unspecified: Secondary | ICD-10-CM | POA: Diagnosis not present

## 2017-08-11 DIAGNOSIS — N289 Disorder of kidney and ureter, unspecified: Secondary | ICD-10-CM

## 2017-08-11 DIAGNOSIS — Z882 Allergy status to sulfonamides status: Secondary | ICD-10-CM | POA: Insufficient documentation

## 2017-08-11 DIAGNOSIS — Z6832 Body mass index (BMI) 32.0-32.9, adult: Secondary | ICD-10-CM | POA: Diagnosis not present

## 2017-08-11 DIAGNOSIS — E039 Hypothyroidism, unspecified: Secondary | ICD-10-CM | POA: Diagnosis not present

## 2017-08-11 DIAGNOSIS — I119 Hypertensive heart disease without heart failure: Secondary | ICD-10-CM | POA: Diagnosis not present

## 2017-08-11 HISTORY — PX: RIGHT/LEFT HEART CATH AND CORONARY ANGIOGRAPHY: CATH118266

## 2017-08-11 LAB — POCT I-STAT 3, VENOUS BLOOD GAS (G3P V)
ACID-BASE DEFICIT: 3 mmol/L — AB (ref 0.0–2.0)
BICARBONATE: 22.3 mmol/L (ref 20.0–28.0)
O2 SAT: 71 %
PCO2 VEN: 41.3 mmHg — AB (ref 44.0–60.0)
TCO2: 24 mmol/L (ref 22–32)
pH, Ven: 7.34 (ref 7.250–7.430)
pO2, Ven: 39 mmHg (ref 32.0–45.0)

## 2017-08-11 LAB — POCT I-STAT 3, ART BLOOD GAS (G3+)
ACID-BASE DEFICIT: 4 mmol/L — AB (ref 0.0–2.0)
BICARBONATE: 22 mmol/L (ref 20.0–28.0)
O2 SAT: 99 %
TCO2: 23 mmol/L (ref 22–32)
pCO2 arterial: 42 mmHg (ref 32.0–48.0)
pH, Arterial: 7.327 — ABNORMAL LOW (ref 7.350–7.450)
pO2, Arterial: 162 mmHg — ABNORMAL HIGH (ref 83.0–108.0)

## 2017-08-11 LAB — GLUCOSE, CAPILLARY: Glucose-Capillary: 150 mg/dL — ABNORMAL HIGH (ref 65–99)

## 2017-08-11 LAB — POCT ACTIVATED CLOTTING TIME: Activated Clotting Time: 164 seconds

## 2017-08-11 SURGERY — RIGHT/LEFT HEART CATH AND CORONARY ANGIOGRAPHY
Anesthesia: LOCAL

## 2017-08-11 MED ORDER — SODIUM CHLORIDE 0.9 % IV SOLN: 250.0000 mL | INTRAVENOUS | Status: DC | PRN

## 2017-08-11 MED ORDER — SODIUM CHLORIDE 0.9% FLUSH: 3.0000 mL | INTRAVENOUS | Status: DC | PRN

## 2017-08-11 MED ORDER — HEPARIN SODIUM (PORCINE) 1000 UNIT/ML IJ SOLN
INTRAMUSCULAR | Status: AC
  Filled 2017-08-11: qty 1

## 2017-08-11 MED ORDER — FENTANYL CITRATE (PF) 100 MCG/2ML IJ SOLN
INTRAMUSCULAR | Status: DC | PRN
  Administered 2017-08-11: 25 ug via INTRAVENOUS

## 2017-08-11 MED ORDER — HEPARIN (PORCINE) IN NACL 2-0.9 UNIT/ML-% IJ SOLN
INTRAMUSCULAR | Status: AC | PRN
  Administered 2017-08-11: 1000 mL

## 2017-08-11 MED ORDER — MIDAZOLAM HCL 2 MG/2ML IJ SOLN
INTRAMUSCULAR | Status: DC | PRN
  Administered 2017-08-11: 1 mg via INTRAVENOUS

## 2017-08-11 MED ORDER — VERAPAMIL HCL 2.5 MG/ML IV SOLN
INTRAVENOUS | Status: DC | PRN
  Administered 2017-08-11: 10 mL via INTRA_ARTERIAL

## 2017-08-11 MED ORDER — SODIUM CHLORIDE 0.9 % WEIGHT BASED INFUSION
3.0000 mL/kg/h | INTRAVENOUS | Status: AC
  Administered 2017-08-11: 3 mL/kg/h via INTRAVENOUS

## 2017-08-11 MED ORDER — SODIUM CHLORIDE 0.9 % WEIGHT BASED INFUSION: 1.0000 mL/kg/h | INTRAVENOUS | Status: DC

## 2017-08-11 MED ORDER — HEPARIN (PORCINE) IN NACL 2-0.9 UNIT/ML-% IJ SOLN
INTRAMUSCULAR | Status: AC
  Filled 2017-08-11: qty 1000

## 2017-08-11 MED ORDER — LIDOCAINE HCL (PF) 1 % IJ SOLN
INTRAMUSCULAR | Status: DC | PRN
  Administered 2017-08-11 (×2): 2 mL

## 2017-08-11 MED ORDER — SODIUM CHLORIDE 0.9% FLUSH: 3.0000 mL | Freq: Two times a day (BID) | INTRAVENOUS | Status: DC

## 2017-08-11 MED ORDER — ASPIRIN 81 MG PO CHEW: 81.0000 mg | CHEWABLE_TABLET | ORAL | Status: DC

## 2017-08-11 MED ORDER — FENTANYL CITRATE (PF) 100 MCG/2ML IJ SOLN
INTRAMUSCULAR | Status: AC
  Filled 2017-08-11: qty 2

## 2017-08-11 MED ORDER — MIDAZOLAM HCL 2 MG/2ML IJ SOLN
INTRAMUSCULAR | Status: AC
  Filled 2017-08-11: qty 2

## 2017-08-11 MED ORDER — HEPARIN SODIUM (PORCINE) 1000 UNIT/ML IJ SOLN
INTRAMUSCULAR | Status: DC | PRN
Start: 2017-08-11 — End: 2017-08-11
  Administered 2017-08-11: 4000 [IU] via INTRAVENOUS

## 2017-08-11 MED ORDER — LIDOCAINE HCL (PF) 1 % IJ SOLN
INTRAMUSCULAR | Status: AC
  Filled 2017-08-11: qty 30

## 2017-08-11 MED ORDER — IOPAMIDOL (ISOVUE-370) INJECTION 76%
INTRAVENOUS | Status: DC | PRN
Start: 2017-08-11 — End: 2017-08-11
  Administered 2017-08-11: 50 mL via INTRA_ARTERIAL

## 2017-08-11 MED ORDER — VERAPAMIL HCL 2.5 MG/ML IV SOLN
INTRAVENOUS | Status: AC
  Filled 2017-08-11: qty 2

## 2017-08-11 SURGICAL SUPPLY — 22 items
CATH 5FR JL3.5 JR4 ANG PIG MP (CATHETERS) ×2 IMPLANT
CATH BALLN WEDGE 5F 110CM (CATHETERS) ×2 IMPLANT
CATH INFINITI 5FR AL1 (CATHETERS) ×2 IMPLANT
CATH LAUNCHER 5F JR4 (CATHETERS) ×2 IMPLANT
DEVICE RAD COMP TR BAND LRG (VASCULAR PRODUCTS) ×2 IMPLANT
GLIDESHEATH SLEND SS 6F .021 (SHEATH) ×2 IMPLANT
GUIDEWIRE .025 260CM (WIRE) ×2 IMPLANT
GUIDEWIRE ANGLED .035X150CM (WIRE) ×2 IMPLANT
GUIDEWIRE INQWIRE 1.5J.035X260 (WIRE) ×1 IMPLANT
INQWIRE 1.5J .035X260CM (WIRE) ×2
KIT ESSENTIALS PG (KITS) ×2 IMPLANT
KIT SINGLE USE MANIFOLD (KITS) ×2 IMPLANT
KIT SYR MULTI USE (KITS) ×2 IMPLANT
PACK CARDIAC CATHETERIZATION (CUSTOM PROCEDURE TRAY) ×2 IMPLANT
PROTECTION STATION PRESSURIZED (MISCELLANEOUS) ×2
SHEATH GLIDE SLENDER 4/5FR (SHEATH) ×2 IMPLANT
STATION PROTECTION PRESSURIZED (MISCELLANEOUS) ×1 IMPLANT
SYR MEDRAD MARK V 150ML (SYRINGE) ×2 IMPLANT
TRANSDUCER W/STOPCOCK (MISCELLANEOUS) ×2 IMPLANT
TUBING CIL FLEX 10 FLL-RA (TUBING) ×2 IMPLANT
WIRE EMERALD ST .035X150CM (WIRE) ×2 IMPLANT
WIRE HI TORQ VERSACORE-J 145CM (WIRE) ×2 IMPLANT

## 2017-08-11 NOTE — Discharge Instructions (Signed)

## 2017-08-11 NOTE — Interval H&P Note (Signed)
History and Physical Interval Note:  08/11/2017 8:16 AM  Kristina FlurryBetty H Haugen  has presented today for surgery, with the diagnosis of as   sob  The various methods of treatment have been discussed with the patient and family. After consideration of risks, benefits and other options for treatment, the patient has consented to  Procedure(s): RIGHT/LEFT HEART CATH AND CORONARY ANGIOGRAPHY (N/A) as a surgical intervention .  The patient's history has been reviewed, patient examined, no change in status, stable for surgery.  I have reviewed the patient's chart and labs.  Questions were answered to the patient's satisfaction.     Tonny BollmanMichael Jaishaun Mcnab

## 2017-08-12 ENCOUNTER — Institutional Professional Consult (permissible substitution) (INDEPENDENT_AMBULATORY_CARE_PROVIDER_SITE_OTHER): Payer: Medicare Other | Admitting: Surgery

## 2017-08-12 VITALS — BP 160/78 | HR 60 | Resp 20 | Ht 65.0 in | Wt 190.0 lb

## 2017-08-12 DIAGNOSIS — I350 Nonrheumatic aortic (valve) stenosis: Secondary | ICD-10-CM | POA: Diagnosis not present

## 2017-08-12 MED FILL — Heparin Sodium (Porcine) 2 Unit/ML in Sodium Chloride 0.9%: INTRAMUSCULAR | Qty: 1000 | Status: AC

## 2017-08-13 ENCOUNTER — Encounter: Payer: Medicare Other | Admitting: Thoracic Surgery (Cardiothoracic Vascular Surgery)

## 2017-08-14 ENCOUNTER — Encounter (HOSPITAL_COMMUNITY): Payer: Medicare Other

## 2017-08-14 ENCOUNTER — Encounter: Payer: Self-pay | Admitting: Surgery

## 2017-08-14 ENCOUNTER — Ambulatory Visit (HOSPITAL_COMMUNITY)
Admission: RE | Admit: 2017-08-14 | Discharge: 2017-08-14 | Disposition: A | Discharge status: Home / Self Care | Payer: Medicare Other | Source: Ambulatory Visit | Attending: Cardiovascular Disease | Admitting: Cardiovascular Disease

## 2017-08-14 ENCOUNTER — Ambulatory Visit (HOSPITAL_BASED_OUTPATIENT_CLINIC_OR_DEPARTMENT_OTHER)
Admission: RE | Admit: 2017-08-14 | Discharge: 2017-08-14 | Disposition: A | Discharge status: Home / Self Care | Payer: Medicare Other | Source: Ambulatory Visit | Attending: Cardiovascular Disease | Admitting: Cardiovascular Disease

## 2017-08-14 ENCOUNTER — Ambulatory Visit (HOSPITAL_COMMUNITY): Payer: Medicare Other

## 2017-08-14 ENCOUNTER — Other Ambulatory Visit (HOSPITAL_COMMUNITY): Payer: Medicare Other

## 2017-08-14 ENCOUNTER — Ambulatory Visit (HOSPITAL_COMMUNITY): Admission: RE | Admit: 2017-08-14 | Payer: Medicare Other | Source: Ambulatory Visit | Admitting: Cardiovascular Disease

## 2017-08-14 ENCOUNTER — Inpatient Hospital Stay (HOSPITAL_COMMUNITY): Admission: RE | Admit: 2017-08-14 | Payer: Medicare Other | Source: Ambulatory Visit

## 2017-08-14 DIAGNOSIS — I700 Atherosclerosis of aorta: Secondary | ICD-10-CM | POA: Insufficient documentation

## 2017-08-14 DIAGNOSIS — K5730 Diverticulosis of large intestine without perforation or abscess without bleeding: Secondary | ICD-10-CM | POA: Diagnosis not present

## 2017-08-14 DIAGNOSIS — I350 Nonrheumatic aortic (valve) stenosis: Secondary | ICD-10-CM

## 2017-08-14 DIAGNOSIS — R0602 Shortness of breath: Secondary | ICD-10-CM | POA: Diagnosis not present

## 2017-08-14 DIAGNOSIS — K449 Diaphragmatic hernia without obstruction or gangrene: Secondary | ICD-10-CM | POA: Insufficient documentation

## 2017-08-14 DIAGNOSIS — N289 Disorder of kidney and ureter, unspecified: Secondary | ICD-10-CM

## 2017-08-14 LAB — PULMONARY FUNCTION TEST
DL/VA % pred: 68 %
DL/VA: 3.36 ml/min/mmHg/L
DLCO unc % pred: 54 %
DLCO unc: 13.95 ml/min/mmHg
FEF 25-75 Post: 1.88 L/sec
FEF 25-75 Pre: 2.16 L/sec
FEF2575-%Change-Post: -13 %
FEF2575-%PRED-POST: 204 %
FEF2575-%Pred-Pre: 235 %
FEV1-%CHANGE-POST: -2 %
FEV1-%PRED-PRE: 112 %
FEV1-%Pred-Post: 110 %
FEV1-POST: 1.83 L
FEV1-Pre: 1.87 L
FEV1FVC-%Change-Post: -1 %
FEV1FVC-%PRED-PRE: 115 %
FEV6-%Change-Post: 0 %
FEV6-%Pred-Post: 105 %
FEV6-%Pred-Pre: 106 %
FEV6-POST: 2.23 L
FEV6-Pre: 2.24 L
FEV6FVC-%Pred-Post: 107 %
FEV6FVC-%Pred-Pre: 107 %
FVC-%CHANGE-POST: 0 %
FVC-%PRED-POST: 98 %
FVC-%PRED-PRE: 99 %
FVC-POST: 2.23 L
FVC-PRE: 2.24 L
POST FEV1/FVC RATIO: 82 %
PRE FEV6/FVC RATIO: 100 %
Post FEV6/FVC ratio: 100 %
Pre FEV1/FVC ratio: 83 %
RV % PRED: 98 %
RV: 2.6 L
TLC % pred: 92 %
TLC: 4.84 L

## 2017-08-14 MED ORDER — IOPAMIDOL (ISOVUE-370) INJECTION 76%
INTRAVENOUS | Status: AC
  Administered 2017-08-14: 100 mL
  Filled 2017-08-14: qty 100

## 2017-08-14 MED ORDER — SODIUM BICARBONATE 8.4 % IV SOLN
INTRAVENOUS | Status: AC
  Administered 2017-08-14: 10:00:00 via INTRAVENOUS
  Filled 2017-08-14: qty 500

## 2017-08-14 MED ORDER — SODIUM BICARBONATE BOLUS VIA INFUSION
INTRAVENOUS | Status: AC
  Administered 2017-08-14: 75 meq via INTRAVENOUS
  Filled 2017-08-14: qty 1

## 2017-08-14 MED ORDER — ALBUTEROL SULFATE (2.5 MG/3ML) 0.083% IN NEBU
2.5000 mg | INHALATION_SOLUTION | Freq: Once | RESPIRATORY_TRACT | Status: AC
  Administered 2017-08-14: 2.5 mg via RESPIRATORY_TRACT

## 2017-08-14 NOTE — Progress Notes (Signed)
Preliminary results by tech - Carotid Duplex Completed. No evidence of a significant stenosis bilaterally. Marilynne Halstedita Julena Barbour, BS, RDMS, RVT

## 2017-08-14 NOTE — Progress Notes (Signed)
Patient ID: Kristina Morrison, female   DOB: 01/09/1925, 84 y.o.   MRN: [id number]  [facility]  MULTIDISCIPLINARY HEART VALVE CLINIC  CARDIOTHORACIC SURGERY CONSULTATION REPORT  Referring Provider is Othella Boyer, MD PCP is Pa, [facility]  Chief Complaint  Patient presents with  . 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  Her atrial fibrillation has been managed with rhythm control using Multaq and anticoagulation with Eliquis.  The patient lives in [place], [place] and has been followed by a cardiologist there.  This past fall she reported shortness of breath with walking up her driveway and a follow-up echocardiogram showed severe aortic stenosis.  She was told that she should consider TAVR and since her daughter lives here in new the medical community she decided to come to [facility] for evaluation.  She was primarily limited by osteoarthritis at that point and Dr. Donnie Aho recommended continued surveillance.  She returned to [place] but has noted progressive shortness of breath occurring with activity such as walking on flat ground or moving around in her house.  She has had some lightheadedness but no syncope.  She has noted some fullness in her chest with activity.  She has also noted a cold sweat with activity.  She denies any symptoms at rest and has had no PND orthopnea.  She has had no peripheral edema.  She recently returned to [facility] and was evaluated by Dr. Excell Seltzer.  A follow-up echocardiogram showed a trileaflet aortic valve with moderately thickened and calcified leaflets.  The mean gradient was 30 mmHg with a peak gradient of 50 mmHg.  The dimensionless index was 0.4.  Left  ventricular ejection fraction was 60-65% with grade 1 diastolic dysfunction. There was mild mitral regurgitation.  She underwent right and left heart catheterization on 08/11/2017.  This showed mild nonobstructive coronary disease with normal right heart pressures.  The mean transvalvular gradient was 27 mmHg with a calculated aortic valve area of 1.1 cm.  The valve was difficult to cross.    Past Medical History:  Diagnosis Date  . Aortic valve stenosis   . Carpal tunnel syndrome   . H/O hernia repair   . Hyperlipidemia   . Hypertensive heart disease without heart failure   . Hypothyroidism   . Long term (current) use of anticoagulants   . Lumbar disc disease   . Obesity   . Paroxysmal atrial fibrillation (HCC)   . SOB (shortness of breath)     Past Surgical History:  Procedure Laterality Date  . ABDOMINAL HYSTERECTOMY    . BREAST BIOPSY    . CARPAL TUNNEL RELEASE    . CERVICAL FUSION    . CHOLECYSTECTOMY    . LUMBAR LAMINECTOMY    . REPLACEMENT TOTAL KNEE Bilateral   . RIGHT/LEFT HEART CATH AND CORONARY ANGIOGRAPHY N/A 08/11/2017   Procedure: RIGHT/LEFT HEART CATH AND CORONARY ANGIOGRAPHY;  Surgeon: Tonny Bollman, MD;  Location: [facility] INVASIVE CV LAB;  Service: Cardiovascular;  Laterality: N/A;    Family History  Problem Relation Age of Onset  . CVA Mother   . Heart attack Father   . Breast cancer Sister     Social History   Socioeconomic History  . Marital  status: Single    Spouse name: Not on file  . Number of children: Not on file  . Years of education: Not on file  . Highest education level: Not on file  Social Needs  . Financial resource strain: Not on file  . Food insecurity - worry: Not on file  . Food insecurity - inability: Not on file  . Transportation needs - medical: Not on file  . Transportation needs - non-medical: Not on file  Occupational History  . Occupation: home maker  Tobacco Use  . Smoking status: Former Smoker    Last attempt to quit:  08/05/1958    Years since quitting: 59.0  . Smokeless tobacco: Never Used  Substance and Sexual Activity  . Alcohol use: Yes  . Drug use: No  . Sexual activity: Not on file  Other Topics Concern  . Not on file  Social History Narrative  . Not on file    Current Outpatient Medications  Medication Sig Dispense Refill  . acetaminophen (TYLENOL 8 HOUR ARTHRITIS PAIN) 650 MG CR tablet Take 650 mg by mouth every 8 (eight) hours as needed for pain.    Marland Kitchen apixaban (ELIQUIS) 5 MG TABS tablet Take 5 mg by mouth 2 (two) times daily.    Marland Kitchen atorvastatin (LIPITOR) 20 MG tablet Take 20 mg by mouth daily.     . carvedilol (COREG) 25 MG tablet Take 25 mg by mouth daily.     . Cholecalciferol (VITAMIN D3) 1000 units CAPS Take 1,000 Units by mouth daily.     . Cinnamon 500 MG capsule Take 500 mg by mouth 2 (two) times daily.    Marland Kitchen dronedarone (MULTAQ) 400 MG tablet Take 400 mg by mouth 2 (two) times daily with a meal.    . famotidine (PEPCID) 20 MG tablet Take 20 mg by mouth at bedtime.     . folic acid (FOLVITE) 400 MCG tablet Take 400 mcg by mouth daily.    . fosinopril (MONOPRIL) 20 MG tablet Take 20 mg by mouth at bedtime.     Marland Kitchen glimepiride (AMARYL) 1 MG tablet Take 1 mg by mouth daily with breakfast.   0  . levothyroxine (SYNTHROID, LEVOTHROID) 50 MCG tablet Take 50 mcg by mouth daily before breakfast.    . Multiple Vitamin (MULTIVITAMIN WITH MINERALS) TABS tablet Take 1 tablet by mouth daily.    . Omega-3 Fatty Acids (FISH OIL) 1000 MG CAPS Take 1,000 mg by mouth 2 (two) times daily.     . predniSONE (DELTASONE) 10 MG tablet Take 5 mg by mouth daily.   2  . vitamin C (ASCORBIC ACID) 500 MG tablet Take 500 mg by mouth 2 (two) times daily.     No current facility-administered medications for this visit.     Allergies  Allergen Reactions  . Cinobac [Cinoxacin] Swelling    Tongue swells.  . Penicillins Hives and Itching    Has patient had a PCN reaction causing immediate rash, facial/tongue/throat  swelling, SOB or lightheadedness with hypotension: Yes Has patient had a PCN reaction causing severe rash involving mucus membranes or skin necrosis: No Has patient had a PCN reaction that required hospitalization: No Has patient had a PCN reaction occurring within the last 10 years: Yes If all of the above answers are "NO", then may proceed with Cephalosporin use.   . Sulfa Antibiotics Other (See Comments)    Unsure of reaction type      Review of Systems:   General:  normal  appetite, decreased energy, no weight gain, no weight loss, no fever  Cardiac:  has chest pain with exertion, no chest pain at rest, has SOB with mild exertion, no resting SOB, no PND, no orthopnea, no palpitations, has arrhythmia, has atrial fibrillation, no LE edema, has dizzy spells, no syncope  Respiratory:  exertional shortness of breath, no home oxygen, no productive cough, no dry cough, no bronchitis, no wheezing, no hemoptysis, no asthma, no pain with inspiration or cough, no sleep apnea, no CPAP at night  GI:   no difficulty swallowing, no reflux, no frequent heartburn, no hiatal hernia, no abdominal pain, no constipation, no diarrhea, no hematochezia, no hematemesis, no melena  GU:   no dysuria,  no frequency, had urinary tract infection 06/2017, no hematuria, no kidney stones, no kidney disease  Vascular:  no pain suggestive of claudication, no pain in feet, no leg cramps, no varicose veins, no DVT, no non-healing foot ulcer  Neuro:   no stroke, no TIA's, no seizures, no headaches, no temporary blindness one eye,  no slurred speech, no peripheral neuropathy, has chronic pain in knees and back, mild instability of gait, no memory/cognitive dysfunction  Musculoskeletal: has arthritis, no joint swelling, no myalgias, some difficulty walking, normal mobility   Skin:   no rash, no itching, no skin infections, no pressure sores or ulcerations  Psych:   no anxiety, no depression, no nervousness, no unusual recent  stress  Eyes:   no blurry vision, no floaters, no recent vision changes,  wears glasses   ENT:   has hearing loss, no loose or painful teeth, no dentures, last saw dentist recently  Hematologic:  no easy bruising, no abnormal bleeding, no clotting disorder, no frequent epistaxis  Endocrine:  has diabetes, does not check CBG's at home           Physical Exam:   BP (!) 160/78   Pulse 60   Resp 20   Ht 5\' 5"  (1.651 m)   Wt 190 lb (86.2 kg)   SpO2 96% Comment: RA  BMI 31.62 kg/m   General:  Elderly but spry and  well-appearing  HEENT:  Unremarkable, NCAT, PERLA, EOMI, oropharynx clear, teeth in good conditon.  Neck:   no JVD, no bruits, no adenopathy or thyromegaly  Chest:   clear to auscultation, symmetrical breath sounds, no wheezes, no rhonchi   CV:   RRR, grade III/VI crescendo/decrescendo murmur heard best at RSB,  no diastolic murmur  Abdomen:  soft, non-tender, no masses or organomegaly  Extremities:  warm, well-perfused, pulses palpable in feet, no LE edema  Rectal/GU  Deferred  Neuro:   Grossly non-focal and symmetrical throughout  Skin:   Clean and dry, no rashes, no breakdown   Diagnostic Tests:  [facility]                        [address]                        [place], [place] [address]                            [phone]  ------------------------------------------------------------------- Transthoracic Echocardiography  Patient:    Lauralyn, Shadowens MR #:       [id number] Study Date: 08/06/2017 Gender:     F Age:        89 Height: Weight: BSA:  Pt. Status: Room:   Dora Sims, MD  REFERRING    Tonny Bollman, MD  PERFORMING   [facility], Outpatient  SONOGRAPHER  [facility], RDCS  ATTENDING    Chilton Si, MD  cc:  ------------------------------------------------------------------- LV EF: 60% -   65%  ------------------------------------------------------------------- Indications:      Aortic  Stenosis (I35.0).  ------------------------------------------------------------------- History:   PMH:   Dyspnea.  Atrial fibrillation.  Risk factors: Family history of coronary artery disease. Former tobacco use. Hypertension. Dyslipidemia.  ------------------------------------------------------------------- Study Conclusions  - Left ventricle: The cavity size was normal. Systolic function was   normal. The estimated ejection fraction was in the range of 60%   to 65%. Wall motion was normal; there were no regional wall   motion abnormalities. Doppler parameters are consistent with   abnormal left ventricular relaxation (grade 1 diastolic   dysfunction). Doppler parameters are consistent with high   ventricular filling pressure. - Aortic valve: Valve mobility was restricted. There was moderate   stenosis. There was mild regurgitation. Valve area (VTI): 1.34   cm^2. Valve area (Vmax): 1.26 cm^2. Valve area (Vmean): 1.22   cm^2. - Mitral valve: Calcified annulus. Transvalvular velocity was   within the normal range. There was no evidence for stenosis.   There was mild regurgitation. - Left atrium: The atrium was mildly dilated. - Right ventricle: The cavity size was normal. Wall thickness was   normal. Systolic function was normal. - Tricuspid valve: There was trivial regurgitation. - Pulmonary arteries: Systolic pressure was within the normal   range. PA peak pressure: 29 mm Hg (S).  ------------------------------------------------------------------- Study data:  No prior study was available for comparison.  Study status:  Routine.  Procedure:  Transthoracic echocardiography. Image quality was adequate.          Transthoracic echocardiography.  M-mode, complete 2D, 3D, spectral Doppler, and color Doppler.  Birthdate:  Patient birthdate: 1925-01-05.  Age: Patient is 84 yr old.  Sex:  Gender: female.  Blood pressure: 126/78  Patient status:  Outpatient.  Study date:  Study  date: 08/06/2017. Study time: 01:07 PM.  Location:   [facility]  -------------------------------------------------------------------  ------------------------------------------------------------------- Left ventricle:  The cavity size was normal. Systolic function was normal. The estimated ejection fraction was in the range of 60% to 65%. Wall motion was normal; there were no regional wall motion abnormalities. Doppler parameters are consistent with abnormal left ventricular relaxation (grade 1 diastolic dysfunction). Doppler parameters are consistent with high ventricular filling pressure.   ------------------------------------------------------------------- Aortic valve:   Trileaflet; moderately thickened, moderately calcified leaflets. Valve mobility was restricted.  Doppler: There was moderate stenosis.   There was mild regurgitation.    VTI ratio of LVOT to aortic valve: 0.43. Valve area (VTI): 1.34 cm^2. Peak velocity ratio of LVOT to aortic valve: 0.4. Valve area (Vmax): 1.26 cm^2. Mean velocity ratio of LVOT to aortic valve: 0.39. Valve area (Vmean): 1.22 cm^2.    Mean gradient (S): 30 mm Hg. Peak gradient (S): 50 mm Hg.  ------------------------------------------------------------------- Aorta:  Aortic root: The aortic root was normal in size.  ------------------------------------------------------------------- Mitral valve:   Calcified annulus. Mobility was not restricted. Doppler:  Transvalvular velocity was within the normal range. There was no evidence for stenosis. There was mild regurgitation.    Peak gradient (D): 4 mm Hg.  ------------------------------------------------------------------- Left atrium:  The atrium was mildly dilated.  ------------------------------------------------------------------- Right ventricle:  The cavity size was normal. Wall thickness was normal. Systolic function was  normal.  ------------------------------------------------------------------- Pulmonic valve:    Structurally normal valve.   Cusp separation was normal.  Doppler:  Transvalvular velocity was within the normal range. There was no evidence for stenosis. There was no regurgitation.  ------------------------------------------------------------------- Tricuspid valve:   Structurally normal valve.    Doppler: Transvalvular velocity was within the normal range. There was trivial regurgitation.  ------------------------------------------------------------------- Pulmonary artery:   The main pulmonary artery was normal-sized. Systolic pressure was within the normal range.  ------------------------------------------------------------------- Right atrium:  The atrium was normal in size.  ------------------------------------------------------------------- Pericardium:  There was no pericardial effusion.  ------------------------------------------------------------------- Systemic veins: Inferior vena cava: The vessel was normal in size. The respirophasic diameter changes were in the normal range (= 50%), consistent with normal central venous pressure.  ------------------------------------------------------------------- Measurements   Left ventricle                          Value       Reference  Stroke volume, 2D                       125   ml    ----------  LV e&', lateral                          8.81  cm/s  ----------  LV E/e&', lateral                        10.82       ----------  LV e&', medial                           6.2   cm/s  ----------  LV E/e&', medial                         15.37       ----------  LV e&', average                          7.51  cm/s  ----------  LV E/e&', average                        12.7        ----------    LVOT                                    Value       Reference  LVOT ID, S                              20    mm    ----------  LVOT area                                3.14  cm^2  ----------  LVOT peak velocity, S                   143   cm/s  ----------  LVOT mean velocity, S                   100   cm/s  ----------  LVOT VTI, S                             39.9  cm    ----------  LVOT peak gradient, S                   8     mm Hg ----------    Aortic valve                            Value       Reference  Aortic valve peak velocity, S           355   cm/s  ----------  Aortic valve mean velocity, S           257   cm/s  ----------  Aortic valve VTI, S                     93.8  cm    ----------  Aortic mean gradient, S                 30    mm Hg ----------  Aortic peak gradient, S                 50    mm Hg ----------  VTI ratio, LVOT/AV                      0.43        ----------  Aortic valve area, VTI                  1.34  cm^2  ----------  Velocity ratio, peak, LVOT/AV           0.4         ----------  Aortic valve area, peak velocity        1.26  cm^2  ----------  Velocity ratio, mean, LVOT/AV           0.39        ----------  Aortic valve area, mean velocity        1.22  cm^2  ----------  Aortic regurg pressure half-time        548   ms    ----------    Left atrium                             Value       Reference  LA volume, S                            62    ml    ----------  LA volume, ES, 1-p A4C                  68    ml    ----------  LA volume, ES, 1-p A2C                  54.1  ml    ----------    Mitral valve                            Value       Reference  Mitral E-wave peak velocity  95.3  cm/s  ----------  Mitral A-wave peak velocity             126   cm/s  ----------  Mitral deceleration time         (H)    394   ms    150 - 230  Mitral peak gradient, D                 4     mm Hg ----------  Mitral E/A ratio, peak                  0.8         ----------    Pulmonary arteries                      Value       Reference  PA pressure, S, DP                      29    mm Hg <=30     Tricuspid valve                         Value       Reference  Tricuspid regurg peak velocity          255   cm/s  ----------  Tricuspid peak RV-RA gradient           26    mm Hg ----------    Right atrium                            Value       Reference  RA ID, S-I, ES, A4C              (H)    59.1  mm    34 - 49  RA area, ES, A4C                        19    cm^2  8.3 - 19.5  RA volume, ES, A/L                      51.7  ml    ----------    Systemic veins                          Value       Reference  Estimated CVP                           3     mm Hg ----------    Right ventricle                         Value       Reference  TAPSE                                   28.3  mm    ----------  RV pressure, S, DP                      29    mm Hg <=30  RV s&', lateral, S  17.7  cm/s  ----------  Legend: (L)  and  (H)  mark values outside specified reference range.  ------------------------------------------------------------------- Prepared and Electronically Authenticated by  Chilton Si, MD 2019-02-07T16:29:46   Physicians   Panel Physicians Referring Physician Case Authorizing Physician  Tonny Bollman, MD (Primary)    Procedures   RIGHT/LEFT HEART CATH AND CORONARY ANGIOGRAPHY  Conclusion   1. Mild nonobstructive CAD 2. Normal right heart pressures and normal PCWP 3. Moderate-severe aortic stenosis with mean transvalvular gradient 27 mmHg, calculated AVA 1.1 square cm (aortic valve is severely calcified and restricted on fluoroscopy and is very difficult to cross)  Pt has typical symptoms of progress aortic stenosis without CAD or other clear causes of progressive dyspnea. Favor continued evaluation for TAVR.  Indications   Severe aortic stenosis [I35.0 (ICD-10-CM)]  Procedural Details/Technique   Technical Details INDICATION: Symptomatic aortic stenosis  PROCEDURAL DETAILS: There was an indwelling IV in a right antecubital vein. Using  normal sterile technique, the IV was changed out for a 5 Fr brachial sheath over a 0.018 inch wire. Venography was performed. A glide wire was used to navigate the venous system and to guide the swan into the heart. The right wrist was then prepped, draped, and anesthetized with 1% lidocaine. Using the modified Seldinger technique a 5/6 French Slender sheath was placed in the right radial artery. Intra-arterial verapamil was administered through the radial artery sheath. IV heparin was administered after a JR4 catheter was advanced into the central aorta. The right subclavian has severe tortuosity. It was difficult to pass a catheter into the aorta. The JR4 catheter was directed into the ascending aorta with a 0.035 inch glide wire. A Swan-Ganz catheter was used for the right heart catheterization. Standard protocol was followed for recording of right heart pressures and sampling of oxygen saturations. Fick cardiac output was calculated. Standard Judkins catheters were used for selective coronary angiography. A guide catheter was used for RCA angiography. The aortic valve is crossed with an AL-1 directing a J-wire. The valve was very difficult to cross. There were no immediate procedural complications. The patient was transferred to the post catheterization recovery area for further monitoring.     Estimated blood loss <50 mL.  During this procedure the patient was administered the following to achieve and maintain moderate conscious sedation: Versed 1 mg, Fentanyl 25 mcg, while the patient's heart rate, blood pressure, and oxygen saturation were continuously monitored. The period of conscious sedation was 61 minutes, of which I was present face-to-face 100% of this time.  Coronary Findings   Diagnostic  Dominance: Right  Left Main  The vessel exhibits minimal luminal irregularities.  Left Anterior Descending  The vessel exhibits minimal luminal irregularities. The vessel is mildly calcified. Mildly  calcified, no obstructive disease.  Left Circumflex  The vessel exhibits minimal luminal irregularities. Large vessel without significant stenosis, large first OM branch  First Obtuse Marginal Branch  The vessel exhibits minimal luminal irregularities.  Right Coronary Artery  Large, dominant vessel. Calcified with mild nonobstructive disease.  Mid RCA lesion 30% stenosed  Mid RCA lesion is 30% stenosed. The lesion is severely calcified.  Intervention   No interventions have been documented.  Left Heart   Aortic Valve There is severe aortic valve stenosis. The aortic valve is calcified. There is restricted aortic valve motion.  Coronary Diagrams   Diagnostic Diagram       Implants     No implant documentation for this case.  MERGE Images   Show  images for CARDIAC CATHETERIZATION   Link to Procedure Log   Procedure Log    Hemo Data    Most Recent Value  Fick Cardiac Output 6.63 L/min  Fick Cardiac Output Index 3.42 (L/min)/BSA  Aortic Mean Gradient 27.3 mmHg  Aortic Peak Gradient 27 mmHg  Aortic Valve Area 1.08  Aortic Value Area Index 0.56 cm2/BSA  RA A Wave 14 mmHg  RA V Wave 9 mmHg  RA Mean 7 mmHg  RV Systolic Pressure 31 mmHg  RV Diastolic Pressure 3 mmHg  RV EDP 7 mmHg  PA Systolic Pressure 27 mmHg  PA Diastolic Pressure 11 mmHg  PA Mean 17 mmHg  PW A Wave 14 mmHg  PW V Wave 13 mmHg  PW Mean 11 mmHg  AO Systolic Pressure 141 mmHg  AO Diastolic Pressure 53 mmHg  AO Mean 85 mmHg  LV Systolic Pressure 161 mmHg  LV Diastolic Pressure 5 mmHg  LV EDP 18 mmHg  Arterial Occlusion Pressure Extended Systolic Pressure 142 mmHg  Arterial Occlusion Pressure Extended Diastolic Pressure 55 mmHg  Arterial Occlusion Pressure Extended Mean Pressure 84 mmHg  Left Ventricular Apex Extended Systolic Pressure 169 mmHg  Left Ventricular Apex Extended Diastolic Pressure 8 mmHg  Left Ventricular Apex Extended EDP Pressure 16 mmHg  QP/QS 1  TPVR Index 4.97 HRUI  TSVR  Index 24.85 HRUI  PVR SVR Ratio 0.08  TPVR/TSVR Ratio 0.2    STS Adult Cardiac Surgery Database Version 2.9 RISK SCORES Procedure: Isolated AVR CALCULATE  Risk of Mortality:  3.080%   Renal Failure:  2.926%   Permanent Stroke:  2.682%   Prolonged Ventilation:  8.190%   DSW Infection:  0.061%   Reoperation:  3.134%   Morbidity or Mortality:  13.650%   Short Length of Stay:  17.785%   Long Length of Stay:  9.866%    Impression:  This 84 year old woman has stage D, moderate-severe, symptomatic aortic stenosis with [place] Heart Association class II symptoms of progressive exertional shortness of breath and fatigue consistent with chronic diastolic congestive heart failure.  In addition she has had some lightheadedness, chest tightness and cold sweat sensation with activity.  I have personally reviewed her recent echocardiogram, cardiac catheterization, and CTA studies.  Her echocardiogram shows a trileaflet aortic valve with moderately calcified and thickened leaflets with restricted mobility and a mean transvalvular gradient of 30 mmHg.  She has normal left ventricular systolic function.  Her mean transvalvular gradient at catheterization was 27 mmHg with a calculated aortic valve area of 1.1 cm.  There is mild nonobstructive coronary disease.  Although her mean transvalvular gradient is still in the moderate range her valve certainly looks severely stenotic and her symptoms are consistent with progressive aortic stenosis.  I think it is reasonable to consider aortic valve replacement in this patient.  Her operative risk for open surgical aortic valve replacement would be at least moderately elevated due to her advanced age and severe degenerative arthritis of her knees and back with reduced mobility.  I think transcatheter aortic valve replacement would be a reasonable alternative for this patient.   The patient and her daughter were counseled at length regarding treatment alternatives for  management of severe symptomatic aortic stenosis. The risks and benefits of surgical intervention has been discussed in detail. Long-term prognosis with medical therapy was discussed. Alternative approaches such as conventional surgical aortic valve replacement, transcatheter aortic valve replacement, and palliative medical therapy were compared and contrasted at length. This discussion was placed in the  context of the patient's own specific clinical presentation and past medical history. All of their questions have been addressed.    The patient has been advised of a variety of complications that might develop with transcatheter aortic valve replacement including but not limited to risks of death, stroke, paravalvular leak, aortic dissection or other major vascular complications, aortic annulus rupture, device embolization, cardiac rupture or perforation, mitral regurgitation, acute myocardial infarction, arrhythmia, heart block or bradycardia requiring permanent pacemaker placement, congestive heart failure, respiratory failure, renal failure, pneumonia, infection, other late complications related to structural valve deterioration or migration, or other complications that might ultimately cause a temporary or permanent loss of functional independence or other long term morbidity. The patient provides full informed consent for the procedure as described if we decide that she is a candidate for transcatheter aortic valve replacement.  All of their questions were answered.     Plan:  She will require a gated cardiac CTA and CTA of the chest, abdomen, and pelvis for further workup.  She will require physical therapy evaluation and pulmonary function testing.  When these are complete she will return for a second surgical evaluation by Dr. Cornelius Moras.   I spent 60 minutes performing this consultation and > 50% of this time was spent face to face counseling and coordinating the care of this patient's moderate to  severe symptomatic aortic stenosis.    Alleen Borne, MD 08/12/2017

## 2017-08-18 ENCOUNTER — Other Ambulatory Visit: Payer: Self-pay

## 2017-08-18 DIAGNOSIS — I350 Nonrheumatic aortic (valve) stenosis: Secondary | ICD-10-CM

## 2017-08-19 ENCOUNTER — Ambulatory Visit: Payer: Medicare Other | Attending: Cardiovascular Disease | Admitting: Physical Therapy

## 2017-08-19 ENCOUNTER — Encounter: Payer: Medicare Other | Admitting: Thoracic Surgery (Cardiothoracic Vascular Surgery)

## 2017-08-19 ENCOUNTER — Other Ambulatory Visit: Payer: Self-pay

## 2017-08-19 ENCOUNTER — Encounter: Payer: Self-pay | Admitting: Physical Therapy

## 2017-08-19 DIAGNOSIS — M6281 Muscle weakness (generalized): Secondary | ICD-10-CM

## 2017-08-19 DIAGNOSIS — R2689 Other abnormalities of gait and mobility: Secondary | ICD-10-CM

## 2017-08-19 DIAGNOSIS — R293 Abnormal posture: Secondary | ICD-10-CM | POA: Insufficient documentation

## 2017-08-19 NOTE — Therapy (Signed)
[facility] [address] [place], [place], [address] Phone: [phone]   Fax:  [phone]  Physical Therapy Evaluation  Patient Details  Name: Kristina Morrison MRN: [id number] Date of Birth: 13-Feb-1925 Referring Provider: Dr. Tonny Bollman   Encounter Date: 08/19/2017  PT End of Session - 08/19/17 1410    Visit Number  1    PT Start Time  1326    PT Stop Time  1405    PT Time Calculation (min)  39 min       Past Medical History:  Diagnosis Date  . Aortic valve stenosis   . Carpal tunnel syndrome   . H/O hernia repair   . Hyperlipidemia   . Hypertensive heart disease without heart failure   . Hypothyroidism   . Long term (current) use of anticoagulants   . Lumbar disc disease   . Obesity   . Paroxysmal atrial fibrillation (HCC)   . SOB (shortness of breath)     Past Surgical History:  Procedure Laterality Date  . ABDOMINAL HYSTERECTOMY    . BREAST BIOPSY    . CARPAL TUNNEL RELEASE    . CERVICAL FUSION    . CHOLECYSTECTOMY    . LUMBAR LAMINECTOMY    . REPLACEMENT TOTAL KNEE Bilateral   . RIGHT/LEFT HEART CATH AND CORONARY ANGIOGRAPHY N/A 08/11/2017   Procedure: RIGHT/LEFT HEART CATH AND CORONARY ANGIOGRAPHY;  Surgeon: Tonny Bollman, MD;  Location: [facility] INVASIVE CV LAB;  Service: Cardiovascular;  Laterality: N/A;    There were no vitals filed for this visit.   Subjective Assessment - 08/19/17 1330    Subjective  past fall - SOB with walking up driveway and has progressed to walking on flat or moving around house, some lightheadedness no syncope, some chest fullness and cold sweat with activity    Patient Stated Goals  to fix heart and return home    Currently in Pain?  Yes    Pain Score  3     Pain Location  Back    Pain Orientation  Posterior;Right    Pain Descriptors / Indicators  -- just hurts    Pain Type  Chronic pain    Aggravating Factors   not known    Pain Relieving Factors  prednisone PT to  monitor but no pain goal to follow         [facility] PT Assessment - 08/19/17 0001      Assessment   Medical Diagnosis  severe aortic stenosis    Referring Provider  Dr. Tonny Bollman    Onset Date/Surgical Date  -- Fall 2018      Precautions   Precautions  None      Restrictions   Weight Bearing Restrictions  No      Balance Screen   Has the patient fallen in the past 6 months  No    Has the patient had a decrease in activity level because of a fear of falling?   No    Is the patient reluctant to leave their home because of a fear of falling?   No      Home Environment   Living Environment  Private residence    Living Arrangements  Alone    Type of Home  House    Home Access  Stairs to enter    Entrance Stairs-Number of Steps  2    Entrance Stairs-Rails  Right;Left;Can reach both    Home Layout  One level  Prior Function   Level of Independence  Independent with community mobility with device single point cane      Posture/Postural Control   Posture/Postural Control  Postural limitations    Postural Limitations  Forward head;Rounded Shoulders mild      ROM / Strength   AROM / PROM / Strength  AROM;Strength      AROM   Overall AROM Comments  grossly WNL      Strength   Overall Strength Comments  grossly 5/5 UE and 3+ to 4/5 knees and hips, 5/5 ankle    Strength Assessment Site  Hand    Right/Left hand  Right;Left    Right Hand Grip (lbs)  34 R hand dominant    Left Hand Grip (lbs)  33      Ambulation/Gait   Gait Comments  Pt ambulates with single point cane with foot slap which patient attributes to her shoe inserts. Gait distance is limited by 58% for age/gender.        [facility] Pre-Surgical Assessment - 08/19/17 0001    5 Meter Walk Test- trial 1  6 sec    5 Meter Walk Test- trial 2  6 sec.     5 Meter Walk Test- trial 3  7 sec.    5 meter walk test average  6.33 sec    4 Stage Balance Test tolerated for:   10 sec.    4 Stage Balance Test Position  2     Sit To Stand Test- trial 1  14 sec.    ADL/IADL Independent with:  Bathing;Dressing;Meal prep;Finances    ADL/IADL Needs Assistance with:  Pincus Badder work    ADL/IADL Fraility Index  Vulnerable    6 Minute Walk- Baseline  yes    BP (mmHg)  155/67    HR (bpm)  65    02 Sat (%RA)  99 %    Modified Borg Scale for Dyspnea  0- Nothing at all    Perceived Rate of Exertion (Borg)  6-    6 Minute Walk Post Test  yes    BP (mmHg)  177/64    HR (bpm)  98    02 Sat (%RA)  96 %    Modified Borg Scale for Dyspnea  5- Strong or hard breathing    Perceived Rate of Exertion (Borg)  16-    Aerobic Endurance Distance Walked  530           Objective measurements completed on examination: See above findings.              PT Education - 08/19/17 1523    Education provided  Yes    Education Details  fall risk    Person(s) Educated  Patient    Methods  Explanation    Comprehension  Verbalized understanding                  Plan - 08/19/17 1411    Clinical Impression Statement  see below    PT Frequency  One time visit    Consulted and Agree with Plan of Care  Patient      Clinical Impression Statement: Pt is a 84 yo female presenting to OP PT for evaluation prior to possible TAVR surgery due to severe aortic stenosis. Pt reports onset of shortness of breath limiting walking up her driveway in the Fall 2018 which has now progressed to limiting her household acitivties. Pt also reports some lightheadedness without syncope as well as chest  fullness and cold sweats. Pt presents with good ROM and fair strength, poor balance and is at high fall risk 4 stage balance test, good walking speed and poor aerobic endurance per 6 minute walk test. Pt ambulated 185' feet in 1:25 before requesting a seated rest beak lasting 1:10. Pt able to resume after rest and ambulate an additional 185 feet before requiring another 1 minute rest break. Pt ambulated a total of 530 feet in 6 minute walk. BP  increased significantly with 6 minute walk test. Based on the Short Physical Performance Battery, patient has a frailty rating of 7/12 with </= 5/12 considered frail.   Patient demonstrated the following deficits and impairments:     Visit Diagnosis: Other abnormalities of gait and mobility  Muscle weakness (generalized)  Abnormal posture     Problem List Patient Active Problem List   Diagnosis Date Noted  . Severe aortic stenosis 08/11/2017    NICOLETTA,DANA, PT 08/19/2017, 3:24 PM  [facility] [address] [place], [place], [address] Phone: [phone]   Fax:  [phone]  Name: Ronny FlurryBetty H Hollibaugh MRN: [id number] Date of Birth: 07/19/1924

## 2017-08-20 ENCOUNTER — Institutional Professional Consult (permissible substitution) (INDEPENDENT_AMBULATORY_CARE_PROVIDER_SITE_OTHER): Payer: Medicare Other | Admitting: Thoracic Surgery (Cardiothoracic Vascular Surgery)

## 2017-08-20 ENCOUNTER — Other Ambulatory Visit: Payer: Self-pay

## 2017-08-20 ENCOUNTER — Encounter: Payer: Self-pay | Admitting: Thoracic Surgery (Cardiothoracic Vascular Surgery)

## 2017-08-20 VITALS — BP 170/76 | HR 61 | Resp 18 | Ht 65.0 in | Wt 190.0 lb

## 2017-08-20 DIAGNOSIS — I350 Nonrheumatic aortic (valve) stenosis: Secondary | ICD-10-CM | POA: Diagnosis not present

## 2017-08-20 NOTE — Patient Instructions (Signed)
Do not take Eliquis  Continue taking all other medications without change through the day before surgery.  Have nothing to eat or drink after midnight the night before surgery.  On the morning of surgery take only Synthroid and a full tablet (10 mg) of Prednisone with a sip of water.

## 2017-08-20 NOTE — Progress Notes (Signed)
[facility]  MULTIDISCIPLINARY HEART VALVE CLINIC  CARDIOTHORACIC SURGERY CONSULTATION REPORT  Referring Provider is Othella Boyer, MD PCP is [facility]  Chief Complaint  Patient presents with  . Aortic Stenosis    2nd TAVR consultation    HPI:  Patient is a 84 year old female with history of aortic stenosis, paroxysmal atrial fibrillation maintaining sinus rhythm on Multaq and long term anticoagulation, hypertension, type 2 diabetes mellitus, hyperlipidemia, and degenerative arthritis for which she is currently taking prednisone who has been referred for second surgical opinion to discuss treatment options for management of severe symptomatic aortic stenosis.  Patient states that she was first told that she had a heart murmur more than 40 years ago.  Approximately 1-1/2 years ago she was diagnosed with atrial fibrillation.  She was started on Multaq and Eliquis at that time.  Echocardiogram revealed aortic stenosis with normal left ventricular systolic function.  Initially the patient remained asymptomatic, but several months ago she began to experience symptoms of exertional shortness of breath.  Her primary cardiologist in [place] recommended referral for possible transcatheter aortic valve replacement.  She was subsequently seen by Dr. Donnie Aho in [place], and initially continued medical therapy was recommended.  However, the patient has developed progressive exertional shortness of breath and fullness across her chest with activity.  She was subsequently referred to Dr. Excell Seltzer for consultation.  Follow-up transthoracic echocardiogram performed August 06, 2017 confirmed the presence of aortic stenosis which was moderate, bordering on severe.  Peak velocity across the aortic valve was measured 3.6 m/s corresponding to mean transvalvular gradient estimated 38 mmHg.  The DVI was reported 0.4.  Left ventricular systolic function remain normal with ejection  fraction estimated 60-65%.  She subsequently underwent left and right heart catheterization by Dr. Excell Seltzer August 11, 2017.  This revealed mild nonobstructive coronary artery disease.  Mean transvalvular gradient across the aortic valve was measured 27 mmHg corresponding to aortic valve area calculated 1.1 cm.  Of note, at the time of catheterization patient's aortic valve was severely calcified and restricted on fluoroscopy and very difficult to cross.  The patient had normal right heart pressures.  The patient was referred for surgical consultation and has been evaluated previously by Dr. Laneta Simmers.  Transcatheter aortic valve replacement was recommended and CT angiography has subsequently been performed.  The patient was referred for a second surgical opinion.  The patient is widowed and lives alone in a private home in [place], [place].  She remains entirely functionally independent.  She drives an automobile, does her own shopping, cooking, cleaning.  She is limited by significant degenerative arthritis particularly affecting both knees and her lower back.  She has recently been treated with oral prednisone for increased pain in her knees.  She describes progressive symptoms of exertional shortness of breath and fatigue.  She gets short of breath with moderate level activity and walking up a slight incline.  This has begun to dramatically limit her activities.  She denies any resting shortness of breath, PND, orthopnea, or palpitations.  She has had intermittent dizzy spells without syncope.  She reports occasional episodes of fullness or tightness across her chest that does not seem to be related to activity.  She ambulates using a cane for stability.  She has not had any mechanical falls.  She has not had any significant bleeding complications on Eliquis, although last fall she had an episode of hematochezia associated with mild iron deficient anemia.  Colonoscopy was performed  and revealed  diverticulosis.  Past Medical History:  Diagnosis Date  . Aortic valve stenosis   . Carpal tunnel syndrome   . H/O hernia repair   . Hyperlipidemia   . Hypertensive heart disease without heart failure   . Hypothyroidism   . Long term (current) use of anticoagulants   . Lumbar disc disease   . Obesity   . Paroxysmal atrial fibrillation (HCC)   . SOB (shortness of breath)     Past Surgical History:  Procedure Laterality Date  . ABDOMINAL HYSTERECTOMY    . BREAST BIOPSY    . CARPAL TUNNEL RELEASE    . CERVICAL FUSION    . CHOLECYSTECTOMY    . LUMBAR LAMINECTOMY    . REPLACEMENT TOTAL KNEE Bilateral   . RIGHT/LEFT HEART CATH AND CORONARY ANGIOGRAPHY N/A 08/11/2017   Procedure: RIGHT/LEFT HEART CATH AND CORONARY ANGIOGRAPHY;  Surgeon: Tonny Bollman, MD;  Location: [facility] INVASIVE CV LAB;  Service: Cardiovascular;  Laterality: N/A;    Family History  Problem Relation Age of Onset  . CVA Mother   . Heart attack Father   . Breast cancer Sister     Social History   Socioeconomic History  . Marital status: Single    Spouse name: Not on file  . Number of children: Not on file  . Years of education: Not on file  . Highest education level: Not on file  Social Needs  . Financial resource strain: Not on file  . Food insecurity - worry: Not on file  . Food insecurity - inability: Not on file  . Transportation needs - medical: Not on file  . Transportation needs - non-medical: Not on file  Occupational History  . Occupation: home maker  Tobacco Use  . Smoking status: Former Smoker    Last attempt to quit: 08/05/1958    Years since quitting: 59.0  . Smokeless tobacco: Never Used  Substance and Sexual Activity  . Alcohol use: Yes  . Drug use: No  . Sexual activity: Not on file  Other Topics Concern  . Not on file  Social History Narrative  . Not on file    Current Outpatient Medications  Medication Sig Dispense Refill  . acetaminophen (TYLENOL 8 HOUR ARTHRITIS PAIN) 650  MG CR tablet Take 650 mg by mouth every 8 (eight) hours as needed for pain.    Marland Kitchen atorvastatin (LIPITOR) 20 MG tablet Take 20 mg by mouth daily.     . carvedilol (COREG) 25 MG tablet Take 25 mg by mouth daily.     . Cholecalciferol (VITAMIN D3) 1000 units CAPS Take 1,000 Units by mouth daily.     . Cinnamon 500 MG capsule Take 500 mg by mouth 2 (two) times daily.    Marland Kitchen dronedarone (MULTAQ) 400 MG tablet Take 400 mg by mouth 2 (two) times daily with a meal.    . famotidine (PEPCID) 20 MG tablet Take 20 mg by mouth at bedtime.     . folic acid (FOLVITE) 400 MCG tablet Take 400 mcg by mouth daily.    . fosinopril (MONOPRIL) 20 MG tablet Take 20 mg by mouth at bedtime.     Marland Kitchen glimepiride (AMARYL) 1 MG tablet Take 1 mg by mouth daily with breakfast.   0  . levothyroxine (SYNTHROID, LEVOTHROID) 50 MCG tablet Take 50 mcg by mouth daily before breakfast.    . Multiple Vitamin (MULTIVITAMIN WITH MINERALS) TABS tablet Take 1 tablet by mouth daily.    . Omega-3 Fatty  Acids (FISH OIL) 1000 MG CAPS Take 1,000 mg by mouth 2 (two) times daily.     . predniSONE (DELTASONE) 10 MG tablet Take 5 mg by mouth daily.   2  . vitamin C (ASCORBIC ACID) 500 MG tablet Take 500 mg by mouth 2 (two) times daily.    Marland Kitchen apixaban (ELIQUIS) 5 MG TABS tablet Take 5 mg by mouth 2 (two) times daily.     No current facility-administered medications for this visit.     Allergies  Allergen Reactions  . Cinobac [Cinoxacin] Swelling    Tongue swells.  . Penicillins Hives and Itching    Has patient had a PCN reaction causing immediate rash, facial/tongue/throat swelling, SOB or lightheadedness with hypotension: Yes Has patient had a PCN reaction causing severe rash involving mucus membranes or skin necrosis: No Has patient had a PCN reaction that required hospitalization: No Has patient had a PCN reaction occurring within the last 10 years: Yes If all of the above answers are "NO", then may proceed with Cephalosporin use.   . Sulfa  Antibiotics Other (See Comments)    Unsure of reaction type      Review of Systems:   General:  normal appetite, decreased energy, no weight gain, no weight loss, no fever  Cardiac:  no chest pain with exertion, occasional chest pain at rest, + SOB with exertion, no resting SOB, no PND, no orthopnea, no palpitations, + arrhythmia, + atrial fibrillation, + LE edema, + dizzy spells, no syncope  Respiratory:  + shortness of breath, no home oxygen, no productive cough, no dry cough, no bronchitis, no wheezing, no hemoptysis, no asthma, no pain with inspiration or cough, no sleep apnea, no CPAP at night  GI:   no difficulty swallowing, no reflux, no frequent heartburn, no hiatal hernia, no abdominal pain, no constipation, no diarrhea, + hematochezia, no hematemesis, no melena  GU:   no dysuria,  + frequency, + urinary tract infection, no hematuria, no kidney stones, no kidney disease  Vascular:  no pain suggestive of claudication, no pain in feet, no leg cramps, no varicose veins, no DVT, no non-healing foot ulcer  Neuro:   no stroke, no TIA's, no seizures, no headaches, no temporary blindness one eye,  no slurred speech, no peripheral neuropathy, + chronic pain, + instability of gait, no memory/cognitive dysfunction  Musculoskeletal: + arthritis, + joint swelling, no myalgias, + difficulty walking, limited mobility   Skin:   no rash, no itching, no skin infections, no pressure sores or ulcerations  Psych:   no anxiety, no depression, no nervousness, no unusual recent stress  Eyes:   no blurry vision, no floaters, no recent vision changes, + wears glasses or contacts  ENT:   no hearing loss, no loose or painful teeth, no dentures, last saw dentist within the past 3 months  Hematologic:  no easy bruising, no abnormal bleeding, no clotting disorder, no frequent epistaxis  Endocrine:  + diabetes, does check CBG's at home           Physical Exam:   BP (!) 170/76 (BP Location: Right Arm, Patient  Position: Sitting, Cuff Size: Normal)   Pulse 61   Resp 18   Ht 5\' 5"  (1.651 m)   Wt 190 lb (86.2 kg)   SpO2 97% Comment: RA  BMI 31.62 kg/m   General:  Moderately obese, elderly but well-appearing  HEENT:  Unremarkable   Neck:   no JVD, no bruits, no adenopathy   Chest:  clear to auscultation, symmetrical breath sounds, no wheezes, no rhonchi   CV:   RRR, grade III/VI crescendo/decrescendo murmur heard best at RSB,  no diastolic murmur  Abdomen:  soft, non-tender, no masses   Extremities:  warm, well-perfused, pulses not palpable, no LE edema  Rectal/GU  Deferred  Neuro:   Grossly non-focal and symmetrical throughout  Skin:   Clean and dry, no rashes, no breakdown   Diagnostic Tests:  Transthoracic Echocardiography  Patient:    Nylia, Gavina MR #:       [id number] Study Date: 08/06/2017 Gender:     F Age:        89 Height: Weight: BSA: Pt. Status: Room:   Dora Sims, MD  REFERRING    Tonny Bollman, MD  PERFORMING   [facility], Outpatient  SONOGRAPHER  [facility], RDCS  ATTENDING    Chilton Si, MD  cc:  ------------------------------------------------------------------- LV EF: 60% -   65%  ------------------------------------------------------------------- Indications:      Aortic Stenosis (I35.0).  ------------------------------------------------------------------- History:   PMH:   Dyspnea.  Atrial fibrillation.  Risk factors: Family history of coronary artery disease. Former tobacco use. Hypertension. Dyslipidemia.  ------------------------------------------------------------------- Study Conclusions  - Left ventricle: The cavity size was normal. Systolic function was   normal. The estimated ejection fraction was in the range of 60%   to 65%. Wall motion was normal; there were no regional wall   motion abnormalities. Doppler parameters are consistent with   abnormal left ventricular relaxation (grade 1 diastolic    dysfunction). Doppler parameters are consistent with high   ventricular filling pressure. - Aortic valve: Valve mobility was restricted. There was moderate   stenosis. There was mild regurgitation. Valve area (VTI): 1.34   cm^2. Valve area (Vmax): 1.26 cm^2. Valve area (Vmean): 1.22   cm^2. - Mitral valve: Calcified annulus. Transvalvular velocity was   within the normal range. There was no evidence for stenosis.   There was mild regurgitation. - Left atrium: The atrium was mildly dilated. - Right ventricle: The cavity size was normal. Wall thickness was   normal. Systolic function was normal. - Tricuspid valve: There was trivial regurgitation. - Pulmonary arteries: Systolic pressure was within the normal   range. PA peak pressure: 29 mm Hg (S).  ------------------------------------------------------------------- Study data:  No prior study was available for comparison.  Study status:  Routine.  Procedure:  Transthoracic echocardiography. Image quality was adequate.          Transthoracic echocardiography.  M-mode, complete 2D, 3D, spectral Doppler, and color Doppler.  Birthdate:  Patient birthdate: January 08, 1925.  Age: Patient is 84 yr old.  Sex:  Gender: female.  Blood pressure: 126/78  Patient status:  Outpatient.  Study date:  Study date: 08/06/2017. Study time: 01:07 PM.  Location:  [facility]  -------------------------------------------------------------------  ------------------------------------------------------------------- Left ventricle:  The cavity size was normal. Systolic function was normal. The estimated ejection fraction was in the range of 60% to 65%. Wall motion was normal; there were no regional wall motion abnormalities. Doppler parameters are consistent with abnormal left ventricular relaxation (grade 1 diastolic dysfunction). Doppler parameters are consistent with high ventricular filling pressure.     ------------------------------------------------------------------- Aortic valve:   Trileaflet; moderately thickened, moderately calcified leaflets. Valve mobility was restricted.  Doppler: There was moderate stenosis.   There was mild regurgitation.    VTI ratio of LVOT to aortic valve: 0.43. Valve area (VTI): 1.34 cm^2. Peak velocity ratio of LVOT to  aortic valve: 0.4. Valve area (Vmax): 1.26 cm^2. Mean velocity ratio of LVOT to aortic valve: 0.39. Valve area (Vmean): 1.22 cm^2.    Mean gradient (S): 30 mm Hg. Peak gradient (S): 50 mm Hg.  ------------------------------------------------------------------- Aorta:  Aortic root: The aortic root was normal in size.  ------------------------------------------------------------------- Mitral valve:   Calcified annulus. Mobility was not restricted. Doppler:  Transvalvular velocity was within the normal range. There was no evidence for stenosis. There was mild regurgitation.    Peak gradient (D): 4 mm Hg.  ------------------------------------------------------------------- Left atrium:  The atrium was mildly dilated.  ------------------------------------------------------------------- Right ventricle:  The cavity size was normal. Wall thickness was normal. Systolic function was normal.  ------------------------------------------------------------------- Pulmonic valve:    Structurally normal valve.   Cusp separation was normal.  Doppler:  Transvalvular velocity was within the normal range. There was no evidence for stenosis. There was no regurgitation.  ------------------------------------------------------------------- Tricuspid valve:   Structurally normal valve.    Doppler: Transvalvular velocity was within the normal range. There was trivial regurgitation.  ------------------------------------------------------------------- Pulmonary artery:   The main pulmonary artery was normal-sized. Systolic pressure was within the  normal range.  ------------------------------------------------------------------- Right atrium:  The atrium was normal in size.  ------------------------------------------------------------------- Pericardium:  There was no pericardial effusion.  ------------------------------------------------------------------- Systemic veins: Inferior vena cava: The vessel was normal in size. The respirophasic diameter changes were in the normal range (= 50%), consistent with normal central venous pressure.  ------------------------------------------------------------------- Measurements   Left ventricle                          Value       Reference  Stroke volume, 2D                       125   ml    ----------  LV e&', lateral                          8.81  cm/s  ----------  LV E/e&', lateral                        10.82       ----------  LV e&', medial                           6.2   cm/s  ----------  LV E/e&', medial                         15.37       ----------  LV e&', average                          7.51  cm/s  ----------  LV E/e&', average                        12.7        ----------    LVOT                                    Value       Reference  LVOT ID, S  20    mm    ----------  LVOT area                               3.14  cm^2  ----------  LVOT peak velocity, S                   143   cm/s  ----------  LVOT mean velocity, S                   100   cm/s  ----------  LVOT VTI, S                             39.9  cm    ----------  LVOT peak gradient, S                   8     mm Hg ----------    Aortic valve                            Value       Reference  Aortic valve peak velocity, S           355   cm/s  ----------  Aortic valve mean velocity, S           257   cm/s  ----------  Aortic valve VTI, S                     93.8  cm    ----------  Aortic mean gradient, S                 30    mm Hg ----------  Aortic peak gradient, S                  50    mm Hg ----------  VTI ratio, LVOT/AV                      0.43        ----------  Aortic valve area, VTI                  1.34  cm^2  ----------  Velocity ratio, peak, LVOT/AV           0.4         ----------  Aortic valve area, peak velocity        1.26  cm^2  ----------  Velocity ratio, mean, LVOT/AV           0.39        ----------  Aortic valve area, mean velocity        1.22  cm^2  ----------  Aortic regurg pressure half-time        548   ms    ----------    Left atrium                             Value       Reference  LA volume, S                            62    ml    ----------  LA volume, ES, 1-p  A4C                  68    ml    ----------  LA volume, ES, 1-p A2C                  54.1  ml    ----------    Mitral valve                            Value       Reference  Mitral E-wave peak velocity             95.3  cm/s  ----------  Mitral A-wave peak velocity             126   cm/s  ----------  Mitral deceleration time         (H)    394   ms    150 - 230  Mitral peak gradient, D                 4     mm Hg ----------  Mitral E/A ratio, peak                  0.8         ----------    Pulmonary arteries                      Value       Reference  PA pressure, S, DP                      29    mm Hg <=30    Tricuspid valve                         Value       Reference  Tricuspid regurg peak velocity          255   cm/s  ----------  Tricuspid peak RV-RA gradient           26    mm Hg ----------    Right atrium                            Value       Reference  RA ID, S-I, ES, A4C              (H)    59.1  mm    34 - 49  RA area, ES, A4C                        19    cm^2  8.3 - 19.5  RA volume, ES, A/L                      51.7  ml    ----------    Systemic veins                          Value       Reference  Estimated CVP                           3     mm Hg ----------    Right ventricle  Value       Reference  TAPSE                                    28.3  mm    ----------  RV pressure, S, DP                      29    mm Hg <=30  RV s&', lateral, S                       17.7  cm/s  ----------  Legend: (L)  and  (H)  mark values outside specified reference range.  ------------------------------------------------------------------- Prepared and Electronically Authenticated by  Chilton Si, MD 2019-02-07T16:29:46    RIGHT/LEFT HEART CATH AND CORONARY ANGIOGRAPHY  Conclusion   1. Mild nonobstructive CAD 2. Normal right heart pressures and normal PCWP 3. Moderate-severe aortic stenosis with mean transvalvular gradient 27 mmHg, calculated AVA 1.1 square cm (aortic valve is severely calcified and restricted on fluoroscopy and is very difficult to cross)  Pt has typical symptoms of progress aortic stenosis without CAD or other clear causes of progressive dyspnea. Favor continued evaluation for TAVR.  Indications   Severe aortic stenosis [I35.0 (ICD-10-CM)]  Procedural Details/Technique   Technical Details INDICATION: Symptomatic aortic stenosis  PROCEDURAL DETAILS: There was an indwelling IV in a right antecubital vein. Using normal sterile technique, the IV was changed out for a 5 Fr brachial sheath over a 0.018 inch wire. Venography was performed. A glide wire was used to navigate the venous system and to guide the swan into the heart. The right wrist was then prepped, draped, and anesthetized with 1% lidocaine. Using the modified Seldinger technique a 5/6 French Slender sheath was placed in the right radial artery. Intra-arterial verapamil was administered through the radial artery sheath. IV heparin was administered after a JR4 catheter was advanced into the central aorta. The right subclavian has severe tortuosity. It was difficult to pass a catheter into the aorta. The JR4 catheter was directed into the ascending aorta with a 0.035 inch glide wire. A Swan-Ganz catheter was used for the right heart catheterization.  Standard protocol was followed for recording of right heart pressures and sampling of oxygen saturations. Fick cardiac output was calculated. Standard Judkins catheters were used for selective coronary angiography. A guide catheter was used for RCA angiography. The aortic valve is crossed with an AL-1 directing a J-wire. The valve was very difficult to cross. There were no immediate procedural complications. The patient was transferred to the post catheterization recovery area for further monitoring.     Estimated blood loss <50 mL.  During this procedure the patient was administered the following to achieve and maintain moderate conscious sedation: Versed 1 mg, Fentanyl 25 mcg, while the patient's heart rate, blood pressure, and oxygen saturation were continuously monitored. The period of conscious sedation was 61 minutes, of which I was present face-to-face 100% of this time.  Coronary Findings   Diagnostic  Dominance: Right  Left Main  The vessel exhibits minimal luminal irregularities.  Left Anterior Descending  The vessel exhibits minimal luminal irregularities. The vessel is mildly calcified. Mildly calcified, no obstructive disease.  Left Circumflex  The vessel exhibits minimal luminal irregularities. Large vessel without significant stenosis, large first OM branch  First Obtuse Marginal Branch  The vessel exhibits minimal luminal irregularities.  Right Coronary  Artery  Large, dominant vessel. Calcified with mild nonobstructive disease.  Mid RCA lesion 30% stenosed  Mid RCA lesion is 30% stenosed. The lesion is severely calcified.  Intervention   No interventions have been documented.  Left Heart   Aortic Valve There is severe aortic valve stenosis. The aortic valve is calcified. There is restricted aortic valve motion.  Coronary Diagrams   Diagnostic Diagram       Implants     No implant documentation for this case.  MERGE Images   Show images for CARDIAC  CATHETERIZATION   Link to Procedure Log   Procedure Log    Hemo Data    Most Recent Value  Fick Cardiac Output 6.63 L/min  Fick Cardiac Output Index 3.42 (L/min)/BSA  Aortic Mean Gradient 27.3 mmHg  Aortic Peak Gradient 27 mmHg  Aortic Valve Area 1.08  Aortic Value Area Index 0.56 cm2/BSA  RA A Wave 14 mmHg  RA V Wave 9 mmHg  RA Mean 7 mmHg  RV Systolic Pressure 31 mmHg  RV Diastolic Pressure 3 mmHg  RV EDP 7 mmHg  PA Systolic Pressure 27 mmHg  PA Diastolic Pressure 11 mmHg  PA Mean 17 mmHg  PW A Wave 14 mmHg  PW V Wave 13 mmHg  PW Mean 11 mmHg  AO Systolic Pressure 141 mmHg  AO Diastolic Pressure 53 mmHg  AO Mean 85 mmHg  LV Systolic Pressure 161 mmHg  LV Diastolic Pressure 5 mmHg  LV EDP 18 mmHg  Arterial Occlusion Pressure Extended Systolic Pressure 142 mmHg  Arterial Occlusion Pressure Extended Diastolic Pressure 55 mmHg  Arterial Occlusion Pressure Extended Mean Pressure 84 mmHg  Left Ventricular Apex Extended Systolic Pressure 169 mmHg  Left Ventricular Apex Extended Diastolic Pressure 8 mmHg  Left Ventricular Apex Extended EDP Pressure 16 mmHg  QP/QS 1  TPVR Index 4.97 HRUI  TSVR Index 24.85 HRUI  PVR SVR Ratio 0.08  TPVR/TSVR Ratio 0.2    Cardiac TAVR CT  TECHNIQUE: The patient was scanned on a Siemens Force 192 slice scanner. A 120 kV retrospective scan was triggered in the ascending thoracic aorta at 140 HU's. Gantry rotation speed was 250 msecs and collimation was .6 mm. No beta blockade or nitro were given. The 3D data set was reconstructed in 5% intervals of the R-R cycle. Systolic and diastolic phases were analyzed on a dedicated work station using MPR, MIP and VRT modes. The patient received 80 cc of contrast.  FINDINGS: Aortic Valve: Tri leaflet and calcified with restricted leaflet motion Nodular calcium in the annulus at the base of the non coronary cusp  Aorta: Moderate calcific aortic atherosclerosis with mild fusiform dilatation of  the ascending root and arch  Sino-tubular Junction: 29.5 mm  Ascending Thoracic Aorta: 40 mm  Aortic Arch: 36 mm  Descending Thoracic Aorta: 28 mm  Sinus of Valsalva Measurements:  Non-coronary: 27 mm  Right - coronary: 25 mm  Left -   coronary: 27 mm  Coronary Artery Height above Annulus:  Left Main: 12 mm above annulus  Right Coronary: 10.8 mm above annulus  Virtual Basal Annulus Measurements:  Maximum / Minimum Diameter: 18.5 mm x 24.2 mm  Perimeter: 68 mm  Area: 360 mm2  Coronary Arteries: Somewhat shallow but likely sufficient height above annulus for deployment  Optimum Fluoroscopic Angle for Delivery: LAO 19 degrees  IMPRESSION: 1. Calcified tri leaflet AV with annulus 360 mm2 suitable for a 23 mm Sapien 3 valve Nodular calcification of annulus at base of non  coronary cusp increases risk of peri valvular regurgitation  2.  Optimum angiographic angle for deployment LAO 16 degrees  3.  No LAA thrombus  4. Somewhat shallow coronary arteries but likely sufficient height above annulus for deployment  5.  Fusiform dilatation of ascending aortic root 4.0 cm  Charlton Haws  Electronically Signed: By: Charlton Haws M.D. On: 08/14/2017 16:08    CT ANGIOGRAPHY CHEST, ABDOMEN AND PELVIS  TECHNIQUE: Multidetector CT imaging through the chest, abdomen and pelvis was performed using the standard protocol during bolus administration of intravenous contrast. Multiplanar reconstructed images and MIPs were obtained and reviewed to evaluate the vascular anatomy.  CONTRAST:  ISOVUE-370 IOPAMIDOL (ISOVUE-370) INJECTION 76%  COMPARISON:  None.  FINDINGS: CTA CHEST FINDINGS  Cardiovascular: Heart size is borderline enlarged. There is no significant pericardial fluid, thickening or pericardial calcification. There is aortic atherosclerosis, as well as atherosclerosis of the great vessels of the mediastinum and the coronary  arteries, including calcified atherosclerotic plaque in the left circumflex and right coronary arteries. Severe thickening calcification of the aortic valve. Severe calcifications of the mitral annulus. Ectasia of the ascending thoracic aorta which measures up to 4.1 cm in diameter.  Mediastinum/Lymph Nodes: No pathologically enlarged mediastinal or hilar lymph nodes. Small hiatal hernia. No axillary lymphadenopathy.  Lungs/Pleura: No suspicious appearing pulmonary nodules or masses. No acute consolidative airspace disease. No pleural effusions.  Musculoskeletal/Soft Tissues: There are no aggressive appearing lytic or blastic lesions noted in the visualized portions of the skeleton.  CTA ABDOMEN AND PELVIS FINDINGS  Hepatobiliary: No suspicious appearing cystic or solid hepatic lesions. No intra or extrahepatic biliary ductal dilatation. Status post cholecystectomy.  Pancreas: No pancreatic mass. No pancreatic ductal dilatation. No pancreatic or peripancreatic fluid or inflammatory changes.  Spleen: Unremarkable.  Adrenals/Urinary Tract: 1.9 cm low-attenuation lesion in the lower pole left kidney is compatible with a simple cyst. Right kidney and bilateral adrenal glands are normal in appearance. No hydroureteronephrosis. Urinary bladder is normal in appearance.  Stomach/Bowel: The appearance of the stomach is normal. No pathologic dilatation of small bowel or colon. Numerous colonic diverticulae are noted, particularly in the sigmoid colon, without surrounding inflammatory changes to suggest an acute diverticulitis at this time. Normal appendix.  Vascular/Lymphatic: Aortic atherosclerosis, without evidence of aneurysm or dissection in the abdominal or pelvic vasculature. Celiac axis, superior mesenteric artery and inferior mesenteric artery are all widely patent without hemodynamically significant stenosis. Single renal arteries bilaterally are widely  patent without hemodynamically significant stenosis. No lymphadenopathy noted in the abdomen or pelvis.  Reproductive: Status post hysterectomy. Ovaries are not confidently identified may be surgically absent or atrophic.  Other: No significant volume of ascites.  No pneumoperitoneum.  Musculoskeletal: There are no aggressive appearing lytic or blastic lesions noted in the visualized portions of the skeleton.  VASCULAR MEASUREMENTS PERTINENT TO TAVR:  AORTA:  Minimal Aortic Diameter-15 x 15 mm  Severity of Aortic Calcification-severe  RIGHT PELVIS:  Right Common Iliac Artery -  Minimal Diameter-12 x 10.4 mm  Tortuosity-mild  Calcification-mild  Right External Iliac Artery -  Minimal Diameter-9 x 7.5 mm  Tortuosity-moderate to severe  Calcification - none  Right Common Femoral Artery -  Minimal Diameter-8.7 x 7.7 mm  Tortuosity-mild  Calcification - none  LEFT PELVIS:  Left Common Iliac Artery -  Minimal Diameter-10.6 x 9.9 mm  Tortuosity-mild  Calcification-mild  Left External Iliac Artery -  Minimal Diameter-8.9 x 7.8 mm  Tortuosity-severe  Calcification - none  Left Common Femoral Artery -  Minimal Diameter-8.3 x 8.6 mm  Tortuosity-mild  Calcification-none  Review of the MIP images confirms the above findings.  IMPRESSION: 1. Vascular findings and measurements pertinent to potential TAVR procedure, as detailed above. 2. Severe thickening calcification of the aortic valve, compatible with the reported clinical history of severe aortic stenosis. 3. Ectasia of the ascending thoracic aorta (4.1 cm in diameter). Recommend annual imaging followup by CTA or MRA. This recommendation follows 2010 ACCF/AHA/AATS/ACR/ASA/SCA/SCAI/SIR/STS/SVM Guidelines for the Diagnosis and Management of Patients with Thoracic Aortic Disease. Circulation. 2010; 121: Z610-R604. 4. Colonic diverticulosis without evidence of  acute diverticulitis at this time. 5. Small hiatal hernia. 6. Additional incidental findings, as above.  Aortic Atherosclerosis (ICD10-I70.0).   Electronically Signed   By: Trudie Reed M.D.   On: 08/14/2017 16:57   STS Risk Calculator  Procedure: Isolated AVR CALCULATE  Risk of Mortality:  4.266%   Renal Failure:  3.760%   Permanent Stroke:  1.722%   Prolonged Ventilation:  10.701%   DSW Infection:  0.228%   Reoperation:  3.145%   Morbidity or Mortality:  16.050%   Short Length of Stay:  10.920%   Long Length of Stay:  12.122%       Impression:  With patient has stage D severe symptomatic aortic stenosis.  She presents with progressive symptoms of exertional shortness of breath and fatigue consistent with chronic diastolic congestive heart failure, New York Heart Association functional class IIb.  She also has had occasional dizzy spells and atypical chest discomfort.  I have personally reviewed the patient's recent transthoracic echocardiogram, diagnostic cardiac catheterization, and CT angiograms.  Echocardiogram confirms the presence of aortic stenosis.  The aortic valve is trileaflet with at least moderate thickening, calcification, and restricted leaflet mobility involving all 3 leaflets.  Peak velocity across the aortic valve was reported 3.6 m/s corresponding to mean transvalvular gradient estimated 30 mmHg.  Diagnostic cardiac catheterization confirmed the presence of aortic stenosis that was bordering on severe and was also notable for the absence of significant coronary artery disease.   Given the patient's recent progression of symptoms I agree that aortic valve replacement seems reasonable.  I would be reluctant to consider this elderly patient a candidate for conventional surgery because of her advanced age and significantly limited mobility.  Cardiac-gated CTA of the heart reveals anatomical characteristics consistent with aortic stenosis suitable for treatment  by transcatheter aortic valve replacement without any significant complicating features, although she does have one nidus of calcification in the aortic annulus which might slightly increase the risk of paravalvular leak.  In addition, the patient's aortic annulus is relatively small in size, which likely contributes to the hemodynamic significance of her underlying disease.  CTA of the aorta and iliac vessels demonstrate what appears to be adequate pelvic vascular access to facilitate a transfemoral approach.   Plan:  The patient and her daughter were counseled at length regarding treatment alternatives for management of severe symptomatic aortic stenosis. Alternative approaches such as conventional aortic valve replacement, transcatheter aortic valve replacement, and palliative medical therapy were compared and contrasted at length.  The risks associated with conventional surgical aortic valve replacement were been discussed in detail, as were expectations for post-operative convalescence, and why I would be reluctant to consider this patient a candidate for conventional surgery.  Issues specific to transcatheter aortic valve replacement were discussed including questions about long term valve durability, the potential for paravalvular leak, possible increased risk of need for permanent pacemaker placement, and other technical complications related to  the procedure itself.  Long-term prognosis with medical therapy was discussed. This discussion was placed in the context of the patient's own specific clinical presentation and past medical history.  All of their questions been addressed.  Patient plans to proceed with transcatheter aortic valve replacement next week as previously scheduled.  She stopped taking Eliquis yesterday in anticipation of surgery.  She has been instructed to continue taking all other medications until the morning of surgery, at which time she will take only her Synthroid and  prednisone.  Following the decision to proceed with transcatheter aortic valve replacement, a discussion has been held regarding what types of management strategies would be attempted intraoperatively in the event of life-threatening complications, including whether or not the patient would be considered a candidate for the use of cardiopulmonary bypass and/or conversion to open sternotomy for attempted surgical intervention.  The patient has been advised of a variety of complications that might develop including but not limited to risks of death, stroke, paravalvular leak, aortic dissection or other major vascular complications, aortic annulus rupture, device embolization, cardiac rupture or perforation, mitral regurgitation, acute myocardial infarction, arrhythmia, heart block or bradycardia requiring permanent pacemaker placement, congestive heart failure, respiratory failure, renal failure, pneumonia, infection, other late complications related to structural valve deterioration or migration, or other complications that might ultimately cause a temporary or permanent loss of functional independence or other long term morbidity.  The patient provides full informed consent for the procedure as described and all questions were answered.   I spent in excess of 90 minutes during the conduct of this office consultation and >50% of this time involved direct face-to-face encounter with the patient for counseling and/or coordination of their care.     Salvatore Decentlarence H. Cornelius Moraswen, MD 08/20/2017 4:02 PM

## 2017-08-21 ENCOUNTER — Encounter (HOSPITAL_COMMUNITY): Payer: Self-pay

## 2017-08-21 ENCOUNTER — Ambulatory Visit (HOSPITAL_COMMUNITY)
Admission: RE | Admit: 2017-08-21 | Discharge: 2017-08-21 | Disposition: A | Discharge status: Home / Self Care | Payer: Medicare Other | Source: Ambulatory Visit | Attending: Cardiovascular Disease | Admitting: Cardiovascular Disease

## 2017-08-21 ENCOUNTER — Other Ambulatory Visit: Payer: Self-pay

## 2017-08-21 ENCOUNTER — Encounter (HOSPITAL_COMMUNITY)
Admission: RE | Admit: 2017-08-21 | Discharge: 2017-08-21 | Disposition: A | Discharge status: Home / Self Care | Payer: Medicare Other | Source: Ambulatory Visit | Attending: Cardiovascular Disease | Admitting: Cardiovascular Disease

## 2017-08-21 DIAGNOSIS — I350 Nonrheumatic aortic (valve) stenosis: Secondary | ICD-10-CM

## 2017-08-21 DIAGNOSIS — Z882 Allergy status to sulfonamides status: Secondary | ICD-10-CM | POA: Diagnosis not present

## 2017-08-21 DIAGNOSIS — I700 Atherosclerosis of aorta: Secondary | ICD-10-CM | POA: Diagnosis not present

## 2017-08-21 DIAGNOSIS — Z96653 Presence of artificial knee joint, bilateral: Secondary | ICD-10-CM | POA: Diagnosis not present

## 2017-08-21 DIAGNOSIS — I119 Hypertensive heart disease without heart failure: Secondary | ICD-10-CM | POA: Diagnosis not present

## 2017-08-21 DIAGNOSIS — I480 Paroxysmal atrial fibrillation: Secondary | ICD-10-CM | POA: Diagnosis not present

## 2017-08-21 DIAGNOSIS — Z9109 Other allergy status, other than to drugs and biological substances: Secondary | ICD-10-CM | POA: Insufficient documentation

## 2017-08-21 DIAGNOSIS — Z79899 Other long term (current) drug therapy: Secondary | ICD-10-CM | POA: Diagnosis not present

## 2017-08-21 DIAGNOSIS — Z01818 Encounter for other preprocedural examination: Secondary | ICD-10-CM | POA: Insufficient documentation

## 2017-08-21 DIAGNOSIS — Z880 Allergy status to penicillin: Secondary | ICD-10-CM | POA: Insufficient documentation

## 2017-08-21 DIAGNOSIS — Z87891 Personal history of nicotine dependence: Secondary | ICD-10-CM | POA: Diagnosis not present

## 2017-08-21 DIAGNOSIS — E039 Hypothyroidism, unspecified: Secondary | ICD-10-CM | POA: Diagnosis not present

## 2017-08-21 DIAGNOSIS — Z7901 Long term (current) use of anticoagulants: Secondary | ICD-10-CM | POA: Insufficient documentation

## 2017-08-21 DIAGNOSIS — Z79890 Hormone replacement therapy (postmenopausal): Secondary | ICD-10-CM | POA: Diagnosis not present

## 2017-08-21 DIAGNOSIS — E785 Hyperlipidemia, unspecified: Secondary | ICD-10-CM | POA: Insufficient documentation

## 2017-08-21 DIAGNOSIS — Z7952 Long term (current) use of systemic steroids: Secondary | ICD-10-CM | POA: Insufficient documentation

## 2017-08-21 HISTORY — DX: Essential (primary) hypertension: I10

## 2017-08-21 HISTORY — DX: Type 2 diabetes mellitus without complications: E11.9

## 2017-08-21 HISTORY — DX: Atherosclerotic heart disease of native coronary artery without angina pectoris: I25.10

## 2017-08-21 HISTORY — DX: Unspecified osteoarthritis, unspecified site: M19.90

## 2017-08-21 HISTORY — DX: Cardiac murmur, unspecified: R01.1

## 2017-08-21 LAB — TYPE AND SCREEN
ABO/RH(D): O POS
Antibody Screen: NEGATIVE

## 2017-08-21 LAB — BLOOD GAS, ARTERIAL
Acid-base deficit: 1.1 mmol/L (ref 0.0–2.0)
BICARBONATE: 22.6 mmol/L (ref 20.0–28.0)
Drawn by: 470591
FIO2: 21
O2 Saturation: 97.5 %
PH ART: 7.434 (ref 7.350–7.450)
PO2 ART: 96.2 mmHg (ref 83.0–108.0)
Patient temperature: 98.6
pCO2 arterial: 34.3 mmHg (ref 32.0–48.0)

## 2017-08-21 LAB — SURGICAL PCR SCREEN
MRSA, PCR: NEGATIVE
STAPHYLOCOCCUS AUREUS: NEGATIVE

## 2017-08-21 LAB — URINALYSIS, ROUTINE W REFLEX MICROSCOPIC
Bacteria, UA: NONE SEEN
Bilirubin Urine: NEGATIVE
GLUCOSE, UA: NEGATIVE mg/dL
Hgb urine dipstick: NEGATIVE
Ketones, ur: NEGATIVE mg/dL
Nitrite: NEGATIVE
PH: 5 (ref 5.0–8.0)
Protein, ur: NEGATIVE mg/dL
Specific Gravity, Urine: 1.013 (ref 1.005–1.030)

## 2017-08-21 LAB — CBC
HEMATOCRIT: 33.2 % — AB (ref 36.0–46.0)
HEMOGLOBIN: 10.3 g/dL — AB (ref 12.0–15.0)
MCH: 29.3 pg (ref 26.0–34.0)
MCHC: 31 g/dL (ref 30.0–36.0)
MCV: 94.3 fL (ref 78.0–100.0)
Platelets: 157 10*3/uL (ref 150–400)
RBC: 3.52 MIL/uL — ABNORMAL LOW (ref 3.87–5.11)
RDW: 15.1 % (ref 11.5–15.5)
WBC: 8.4 10*3/uL (ref 4.0–10.5)

## 2017-08-21 LAB — COMPREHENSIVE METABOLIC PANEL
ALK PHOS: 49 U/L (ref 38–126)
ALT: 18 U/L (ref 14–54)
ANION GAP: 10 (ref 5–15)
AST: 21 U/L (ref 15–41)
Albumin: 3.6 g/dL (ref 3.5–5.0)
BILIRUBIN TOTAL: 0.7 mg/dL (ref 0.3–1.2)
BUN: 11 mg/dL (ref 6–20)
CALCIUM: 8.9 mg/dL (ref 8.9–10.3)
CO2: 20 mmol/L — ABNORMAL LOW (ref 22–32)
Chloride: 109 mmol/L (ref 101–111)
Creatinine, Ser: 0.85 mg/dL (ref 0.44–1.00)
GFR, EST NON AFRICAN AMERICAN: 59 mL/min — AB (ref >60)
Glucose, Bld: 210 mg/dL — ABNORMAL HIGH (ref 65–99)
Potassium: 4.7 mmol/L (ref 3.5–5.1)
Sodium: 139 mmol/L (ref 135–145)
TOTAL PROTEIN: 6.5 g/dL (ref 6.5–8.1)

## 2017-08-21 LAB — ABO/RH: ABO/RH(D): O POS

## 2017-08-21 LAB — APTT: aPTT: 33 seconds (ref 24–36)

## 2017-08-21 LAB — PROTIME-INR
INR: 1.05
Prothrombin Time: 13.6 seconds (ref 11.4–15.2)

## 2017-08-21 LAB — GLUCOSE, CAPILLARY: GLUCOSE-CAPILLARY: 215 mg/dL — AB (ref 65–99)

## 2017-08-21 NOTE — Progress Notes (Signed)
[place] seeing Dr. Donnie Ahoilley here in [place] at advise of daughter. Dr, Excell Seltzerooper & Dr. Laneta SimmersBartle will follow for TAVR. Pt. Denies chest pain, flu like symptoms. Pt. & her daughter are aware of need to hold Eliqiuis , last dose taken Wed. 2/20.

## 2017-08-21 NOTE — Pre-Procedure Instructions (Signed)
Ronny FlurryBetty H Fauver  08/21/2017      [facility] - [place], [place] - [address] [place] [id number] Phone: [phone] Fax: [phone]    Your procedure is scheduled on 08/25/2017  [facility] at 11:00 A.M.  Call this number if you have problems the morning of surgery:  [phone]   Remember:  Do not eat food or drink liquids after midnight. On MONDAY   Take these medicines the morning of surgery with A SIP OF WATER:              Thyroid  & Prednisone 10 mg.    Do not wear jewelry, make-up or nail polish.   Do not wear lotions, powders, or perfumes, or deodorant.   Do not shave 48 hours prior to surgery.     Do not bring valuables to the hospital.   [facility] is not responsible for any belongings or valuables.  Contacts, dentures or bridgework may not be worn into surgery.  Leave your suitcase in the car.  After surgery it may be brought to your room.   For patients admitted to the hospital, discharge time will be determined by your treatment team.  Patients discharged the day of surgery will not be allowed to drive home.   Name and phone number of your driver:   Family  Special instructions:     How to Manage Your Diabetes Before and After Surgery  Why is it important to control my blood sugar before and after surgery? . Improving blood sugar levels before and after surgery helps healing and can limit problems. . A way of improving blood sugar control is eating a healthy diet by: o  Eating less sugar and carbohydrates o  Increasing activity/exercise o  Talking with your doctor about reaching your blood sugar goals . High blood sugars (greater than 180 mg/dL) can raise your risk of infections and slow your recovery, so you will need to focus on controlling your diabetes during the weeks before surgery. . Make sure that the doctor who takes care of your diabetes knows about your planned  surgery including the date and location.  How do I manage my blood sugar before surgery? . Check your blood sugar at least 4 times a day, starting 2 days before surgery, to make sure that the level is not too high or low. o Check your blood sugar the morning of your surgery when you wake up and every 2 hours until you get to the Short Stay unit. . If your blood sugar is less than 70 mg/dL, you will need to treat for low blood sugar: o Do not take insulin. o Treat a low blood sugar (less than 70 mg/dL) with  cup of clear juice (cranberry or apple), 4 glucose tablets, OR glucose gel. Recheck blood sugar in 15 minutes after treatment (to make sure it is greater than 70 mg/dL). If your blood sugar is not greater than 70 mg/dL on recheck, call [phone] o  for further instructions. . Report your blood sugar to the short stay nurse when you get to Short Stay.  . If you are admitted to the hospital after surgery: o Your blood sugar will be checked by the staff and you will probably be given insulin after surgery (instead of oral diabetes medicines) to make sure you have good blood sugar levels. o The goal for blood sugar control after surgery is 80-180  mg/dL.              WHAT DO I DO ABOUT MY DIABETES MEDICATION?   Marland Kitchen Do not take oral diabetes medicines (pills) the morning of surgery.  .       . THE MORNING OF SURGERY, take _____________ units of __________insulin.  .    Other Instructions:Special Instructions: [place] - Preparing for Surgery  Before surgery, you can play an important role.  Because skin is not sterile, your skin needs to be as free of germs as possible.  You can reduce the number of germs on you skin by washing with CHG (chlorahexidine gluconate) soap before surgery.  CHG is an antiseptic cleaner which kills germs and bonds with the skin to continue killing germs even after washing.  Please DO NOT use if you have an allergy to CHG or antibacterial soaps.   If your skin becomes reddened/irritated stop using the CHG and inform your nurse when you arrive at Short Stay.  Do not shave (including legs and underarms) for at least 48 hours prior to the first CHG shower.  You may shave your face.  Please follow these instructions carefully:   1.  Shower with CHG Soap the night before surgery and the  morning of Surgery.  2.  If you choose to wash your hair, wash your hair first as usual with your  normal shampoo.  3.  After you shampoo, rinse your hair and body thoroughly to remove the  Shampoo.  4.  Use CHG as you would any other liquid soap.  You can apply chg directly to the skin and wash gently with scrungie or a clean washcloth.  5.  Apply the CHG Soap to your body ONLY FROM THE NECK DOWN.    Do not use on open wounds or open sores.  Avoid contact with your eyes, ears, mouth and genitals (private parts).  Wash genitals (private parts)   with your normal soap.  6.  Wash thoroughly, paying special attention to the area where your surgery will be performed.  7.  Thoroughly rinse your body with warm water from the neck down.  8.  DO NOT shower/wash with your normal soap after using and rinsing off   the CHG Soap.  9.  Pat yourself dry with a clean towel.            10.  Wear clean pajamas.            11.  Place clean sheets on your bed the night of your first shower and do not sleep with pets.  Day of Surgery  Do not apply any lotions/deodorants the morning of surgery.  Please wear clean clothes to the hospital/surgery center.          Patient Signature:  Date:   Nurse Signature:  Date:   Reviewed and Endorsed by [facility] Patient Education Committee, August 2015  Please read over the following fact sheets that you were given. Pain Booklet, Coughing and Deep Breathing, MRSA Information and Surgical Site Infection Prevention

## 2017-08-22 LAB — HEMOGLOBIN A1C
HEMOGLOBIN A1C: 7.3 % — AB (ref 4.8–5.6)
Mean Plasma Glucose: 163 mg/dL

## 2017-08-24 MED ORDER — NOREPINEPHRINE BITARTRATE 1 MG/ML IV SOLN
0.0000 ug/min | INTRAVENOUS | Status: AC
  Administered 2017-08-25: 4 ug/min via INTRAVENOUS
  Filled 2017-08-24: qty 4

## 2017-08-24 MED ORDER — NITROGLYCERIN IN D5W 200-5 MCG/ML-% IV SOLN
2.0000 ug/min | INTRAVENOUS | Status: DC
  Filled 2017-08-24: qty 250

## 2017-08-24 MED ORDER — SODIUM CHLORIDE 0.9 % IV SOLN
INTRAVENOUS | Status: DC
  Filled 2017-08-24: qty 30

## 2017-08-24 MED ORDER — MAGNESIUM SULFATE 50 % IJ SOLN
40.0000 meq | INTRAMUSCULAR | Status: DC
  Filled 2017-08-24: qty 9.85

## 2017-08-24 MED ORDER — PHENYLEPHRINE HCL 10 MG/ML IJ SOLN
30.0000 ug/min | INTRAMUSCULAR | Status: DC
  Filled 2017-08-24: qty 2

## 2017-08-24 MED ORDER — VANCOMYCIN HCL 10 G IV SOLR
1500.0000 mg | INTRAVENOUS | Status: AC
  Administered 2017-08-25: 1500 mg via INTRAVENOUS
  Filled 2017-08-24: qty 1500

## 2017-08-24 MED ORDER — POTASSIUM CHLORIDE 2 MEQ/ML IV SOLN
80.0000 meq | INTRAVENOUS | Status: DC
  Filled 2017-08-24: qty 40

## 2017-08-24 MED ORDER — DEXTROSE 5 % IV SOLN
0.0000 ug/min | INTRAVENOUS | Status: DC
  Filled 2017-08-24: qty 4

## 2017-08-24 MED ORDER — DEXMEDETOMIDINE HCL IN NACL 400 MCG/100ML IV SOLN
0.1000 ug/kg/h | INTRAVENOUS | Status: AC
  Administered 2017-08-25: .7 ug/kg/h via INTRAVENOUS
  Filled 2017-08-24: qty 100

## 2017-08-24 MED ORDER — SODIUM CHLORIDE 0.9 % IV SOLN: INTRAVENOUS | Status: DC

## 2017-08-24 MED ORDER — DOPAMINE-DEXTROSE 3.2-5 MG/ML-% IV SOLN
0.0000 ug/kg/min | INTRAVENOUS | Status: DC
  Filled 2017-08-24: qty 250

## 2017-08-24 MED ORDER — SODIUM CHLORIDE 0.9 % IV SOLN
INTRAVENOUS | Status: DC
  Filled 2017-08-24: qty 1

## 2017-08-25 ENCOUNTER — Encounter (HOSPITAL_COMMUNITY): Payer: Self-pay | Admitting: *Deleted

## 2017-08-25 ENCOUNTER — Inpatient Hospital Stay (HOSPITAL_COMMUNITY): Payer: Medicare Other | Admitting: Certified Registered Nurse Anesthetist

## 2017-08-25 ENCOUNTER — Inpatient Hospital Stay (HOSPITAL_COMMUNITY)
Admission: RE | Admit: 2017-08-25 | Discharge: 2017-08-27 | DRG: 267 | Disposition: A | Discharge status: Home / Self Care | Payer: Medicare Other | Source: Ambulatory Visit | Attending: Cardiovascular Disease | Admitting: Cardiovascular Disease

## 2017-08-25 ENCOUNTER — Inpatient Hospital Stay (HOSPITAL_COMMUNITY): Payer: Medicare Other

## 2017-08-25 ENCOUNTER — Other Ambulatory Visit: Payer: Self-pay

## 2017-08-25 ENCOUNTER — Encounter (HOSPITAL_COMMUNITY)
Admission: RE | Disposition: A | Discharge status: Home / Self Care | Payer: Self-pay | Source: Ambulatory Visit | Attending: Cardiovascular Disease

## 2017-08-25 ENCOUNTER — Ambulatory Visit (HOSPITAL_COMMUNITY)
Admission: RE | Admit: 2017-08-25 | Discharge: 2017-08-25 | Disposition: A | Discharge status: Home / Self Care | Payer: Medicare Other | Source: Ambulatory Visit | Attending: Cardiovascular Disease | Admitting: Cardiovascular Disease

## 2017-08-25 DIAGNOSIS — Z8249 Family history of ischemic heart disease and other diseases of the circulatory system: Secondary | ICD-10-CM

## 2017-08-25 DIAGNOSIS — Z882 Allergy status to sulfonamides status: Secondary | ICD-10-CM | POA: Diagnosis not present

## 2017-08-25 DIAGNOSIS — Z006 Encounter for examination for normal comparison and control in clinical research program: Secondary | ICD-10-CM | POA: Diagnosis not present

## 2017-08-25 DIAGNOSIS — Z952 Presence of prosthetic heart valve: Secondary | ICD-10-CM

## 2017-08-25 DIAGNOSIS — Z7952 Long term (current) use of systemic steroids: Secondary | ICD-10-CM

## 2017-08-25 DIAGNOSIS — E785 Hyperlipidemia, unspecified: Secondary | ICD-10-CM | POA: Diagnosis present

## 2017-08-25 DIAGNOSIS — I5032 Chronic diastolic (congestive) heart failure: Secondary | ICD-10-CM | POA: Diagnosis present

## 2017-08-25 DIAGNOSIS — Z7984 Long term (current) use of oral hypoglycemic drugs: Secondary | ICD-10-CM | POA: Diagnosis not present

## 2017-08-25 DIAGNOSIS — Z881 Allergy status to other antibiotic agents status: Secondary | ICD-10-CM

## 2017-08-25 DIAGNOSIS — I361 Nonrheumatic tricuspid (valve) insufficiency: Secondary | ICD-10-CM | POA: Diagnosis not present

## 2017-08-25 DIAGNOSIS — I110 Hypertensive heart disease with heart failure: Secondary | ICD-10-CM | POA: Diagnosis present

## 2017-08-25 DIAGNOSIS — Z981 Arthrodesis status: Secondary | ICD-10-CM

## 2017-08-25 DIAGNOSIS — Z9049 Acquired absence of other specified parts of digestive tract: Secondary | ICD-10-CM

## 2017-08-25 DIAGNOSIS — I493 Ventricular premature depolarization: Secondary | ICD-10-CM | POA: Diagnosis not present

## 2017-08-25 DIAGNOSIS — Z79890 Hormone replacement therapy (postmenopausal): Secondary | ICD-10-CM

## 2017-08-25 DIAGNOSIS — Z7901 Long term (current) use of anticoagulants: Secondary | ICD-10-CM

## 2017-08-25 DIAGNOSIS — K219 Gastro-esophageal reflux disease without esophagitis: Secondary | ICD-10-CM | POA: Diagnosis present

## 2017-08-25 DIAGNOSIS — E039 Hypothyroidism, unspecified: Secondary | ICD-10-CM | POA: Diagnosis present

## 2017-08-25 DIAGNOSIS — Z87891 Personal history of nicotine dependence: Secondary | ICD-10-CM | POA: Diagnosis not present

## 2017-08-25 DIAGNOSIS — E119 Type 2 diabetes mellitus without complications: Secondary | ICD-10-CM | POA: Diagnosis present

## 2017-08-25 DIAGNOSIS — Z880 Allergy status to penicillin: Secondary | ICD-10-CM | POA: Diagnosis not present

## 2017-08-25 DIAGNOSIS — I480 Paroxysmal atrial fibrillation: Secondary | ICD-10-CM | POA: Diagnosis present

## 2017-08-25 DIAGNOSIS — Z96653 Presence of artificial knee joint, bilateral: Secondary | ICD-10-CM | POA: Diagnosis present

## 2017-08-25 DIAGNOSIS — Z803 Family history of malignant neoplasm of breast: Secondary | ICD-10-CM

## 2017-08-25 DIAGNOSIS — D62 Acute posthemorrhagic anemia: Secondary | ICD-10-CM | POA: Diagnosis not present

## 2017-08-25 DIAGNOSIS — Z954 Presence of other heart-valve replacement: Secondary | ICD-10-CM

## 2017-08-25 DIAGNOSIS — I350 Nonrheumatic aortic (valve) stenosis: Secondary | ICD-10-CM

## 2017-08-25 DIAGNOSIS — R51 Headache: Secondary | ICD-10-CM | POA: Diagnosis not present

## 2017-08-25 DIAGNOSIS — I2510 Atherosclerotic heart disease of native coronary artery without angina pectoris: Secondary | ICD-10-CM | POA: Diagnosis present

## 2017-08-25 DIAGNOSIS — N289 Disorder of kidney and ureter, unspecified: Secondary | ICD-10-CM

## 2017-08-25 DIAGNOSIS — Z90710 Acquired absence of both cervix and uterus: Secondary | ICD-10-CM

## 2017-08-25 HISTORY — PX: TEE WITHOUT CARDIOVERSION: SHX5443

## 2017-08-25 HISTORY — PX: TRANSCATHETER AORTIC VALVE REPLACEMENT, TRANSFEMORAL: SHX6400

## 2017-08-25 LAB — GLUCOSE, CAPILLARY
GLUCOSE-CAPILLARY: 125 mg/dL — AB (ref 65–99)
GLUCOSE-CAPILLARY: 200 mg/dL — AB (ref 65–99)
GLUCOSE-CAPILLARY: 202 mg/dL — AB (ref 65–99)

## 2017-08-25 LAB — BASIC METABOLIC PANEL
Anion gap: 13 (ref 5–15)
BUN: 16 mg/dL (ref 6–20)
CALCIUM: 9.1 mg/dL (ref 8.9–10.3)
CO2: 20 mmol/L — ABNORMAL LOW (ref 22–32)
CREATININE: 1.11 mg/dL — AB (ref 0.44–1.00)
Chloride: 109 mmol/L (ref 101–111)
GFR calc Af Amer: 49 mL/min — ABNORMAL LOW (ref >60)
GFR, EST NON AFRICAN AMERICAN: 42 mL/min — AB (ref >60)
GLUCOSE: 131 mg/dL — AB (ref 65–99)
Potassium: 4.8 mmol/L (ref 3.5–5.1)
Sodium: 142 mmol/L (ref 135–145)

## 2017-08-25 LAB — POCT I-STAT 4, (NA,K, GLUC, HGB,HCT)
Glucose, Bld: 259 mg/dL — ABNORMAL HIGH (ref 65–99)
HEMATOCRIT: 27 % — AB (ref 36.0–46.0)
Hemoglobin: 9.2 g/dL — ABNORMAL LOW (ref 12.0–15.0)
Potassium: 4.6 mmol/L (ref 3.5–5.1)
Sodium: 141 mmol/L (ref 135–145)

## 2017-08-25 LAB — POCT I-STAT 3, ART BLOOD GAS (G3+)
Acid-base deficit: 1 mmol/L (ref 0.0–2.0)
BICARBONATE: 25 mmol/L (ref 20.0–28.0)
O2 Saturation: 97 %
PCO2 ART: 42.9 mmHg (ref 32.0–48.0)
PO2 ART: 92 mmHg (ref 83.0–108.0)
Patient temperature: 97.6
TCO2: 26 mmol/L (ref 22–32)
pH, Arterial: 7.371 (ref 7.350–7.450)

## 2017-08-25 LAB — BRAIN NATRIURETIC PEPTIDE: B NATRIURETIC PEPTIDE 5: 187.2 pg/mL — AB (ref 0.0–100.0)

## 2017-08-25 SURGERY — IMPLANTATION, AORTIC VALVE, TRANSCATHETER, FEMORAL APPROACH
Anesthesia: Monitor Anesthesia Care | Site: Chest

## 2017-08-25 MED ORDER — ONDANSETRON HCL 4 MG/2ML IJ SOLN
4.0000 mg | Freq: Four times a day (QID) | INTRAMUSCULAR | Status: DC | PRN
  Administered 2017-08-26: 4 mg via INTRAVENOUS
  Filled 2017-08-25: qty 2

## 2017-08-25 MED ORDER — VITAMIN C 500 MG PO TABS
500.0000 mg | ORAL_TABLET | Freq: Two times a day (BID) | ORAL | Status: DC
  Administered 2017-08-25 – 2017-08-27 (×4): 500 mg via ORAL
  Filled 2017-08-25 (×4): qty 1

## 2017-08-25 MED ORDER — PREDNISONE 5 MG PO TABS
5.0000 mg | ORAL_TABLET | Freq: Every day | ORAL | Status: DC
  Administered 2017-08-26 – 2017-08-27 (×2): 5 mg via ORAL
  Filled 2017-08-25 (×2): qty 1

## 2017-08-25 MED ORDER — OXYCODONE HCL 5 MG PO TABS: 5.0000 mg | ORAL_TABLET | ORAL | Status: DC | PRN

## 2017-08-25 MED ORDER — CHLORHEXIDINE GLUCONATE 4 % EX LIQD: 60.0000 mL | Freq: Once | CUTANEOUS | Status: DC

## 2017-08-25 MED ORDER — SODIUM CHLORIDE 0.9 % IV SOLN: 250.0000 mL | INTRAVENOUS | Status: DC | PRN

## 2017-08-25 MED ORDER — ONDANSETRON HCL 4 MG/2ML IJ SOLN
INTRAMUSCULAR | Status: DC | PRN
  Administered 2017-08-25: 4 mg via INTRAVENOUS

## 2017-08-25 MED ORDER — MIDAZOLAM HCL 2 MG/2ML IJ SOLN
INTRAMUSCULAR | Status: AC
  Filled 2017-08-25: qty 2

## 2017-08-25 MED ORDER — CHLORHEXIDINE GLUCONATE 0.12 % MT SOLN: 15.0000 mL | OROMUCOSAL | Status: DC

## 2017-08-25 MED ORDER — METOPROLOL TARTRATE 5 MG/5ML IV SOLN: 2.5000 mg | INTRAVENOUS | Status: DC | PRN

## 2017-08-25 MED ORDER — FAMOTIDINE 20 MG PO TABS: 20.0000 mg | ORAL_TABLET | Freq: Every day | ORAL | Status: DC

## 2017-08-25 MED ORDER — ALBUMIN HUMAN 5 % IV SOLN: 250.0000 mL | INTRAVENOUS | Status: DC | PRN

## 2017-08-25 MED ORDER — LACTATED RINGERS IV SOLN
INTRAVENOUS | Status: DC | PRN
  Administered 2017-08-25: 11:00:00 via INTRAVENOUS

## 2017-08-25 MED ORDER — ADULT MULTIVITAMIN W/MINERALS CH
1.0000 | ORAL_TABLET | Freq: Every day | ORAL | Status: DC
  Administered 2017-08-26 – 2017-08-27 (×2): 1 via ORAL
  Filled 2017-08-25 (×2): qty 1

## 2017-08-25 MED ORDER — FENTANYL CITRATE (PF) 100 MCG/2ML IJ SOLN
25.0000 ug | Freq: Once | INTRAMUSCULAR | Status: AC
Start: 2017-08-25 — End: 2017-08-25
  Administered 2017-08-25: 25 ug via INTRAVENOUS

## 2017-08-25 MED ORDER — LACTATED RINGERS IV SOLN: INTRAVENOUS | Status: DC

## 2017-08-25 MED ORDER — DRONEDARONE HCL 400 MG PO TABS
400.0000 mg | ORAL_TABLET | Freq: Two times a day (BID) | ORAL | Status: DC
  Administered 2017-08-25 – 2017-08-27 (×4): 400 mg via ORAL
  Filled 2017-08-25 (×5): qty 1

## 2017-08-25 MED ORDER — LEVOTHYROXINE SODIUM 50 MCG PO TABS
50.0000 ug | ORAL_TABLET | Freq: Every day | ORAL | Status: DC
  Administered 2017-08-26 – 2017-08-27 (×2): 50 ug via ORAL
  Filled 2017-08-25 (×2): qty 1

## 2017-08-25 MED ORDER — SODIUM CHLORIDE 0.9 % IV SOLN
1.0000 mL/kg/h | INTRAVENOUS | Status: AC
  Administered 2017-08-25: 1 mL/kg/h via INTRAVENOUS

## 2017-08-25 MED ORDER — LIDOCAINE HCL (PF) 1 % IJ SOLN
INTRAMUSCULAR | Status: DC | PRN
  Administered 2017-08-25: 2 mL

## 2017-08-25 MED ORDER — FENTANYL CITRATE (PF) 250 MCG/5ML IJ SOLN
INTRAMUSCULAR | Status: AC
  Filled 2017-08-25: qty 5

## 2017-08-25 MED ORDER — CHLORHEXIDINE GLUCONATE 0.12 % MT SOLN
15.0000 mL | Freq: Once | OROMUCOSAL | Status: AC
  Administered 2017-08-25: 15 mL via OROMUCOSAL
  Filled 2017-08-25: qty 15

## 2017-08-25 MED ORDER — MIDAZOLAM HCL 2 MG/2ML IJ SOLN: 2.0000 mg | INTRAMUSCULAR | Status: DC | PRN

## 2017-08-25 MED ORDER — LACTATED RINGERS IV SOLN
INTRAVENOUS | Status: DC
  Administered 2017-08-25 (×2): via INTRAVENOUS

## 2017-08-25 MED ORDER — FAMOTIDINE 20 MG IN NS 100 ML IVPB
20.0000 mg | Freq: Two times a day (BID) | INTRAVENOUS | Status: AC
  Administered 2017-08-25 (×2): 20 mg via INTRAVENOUS
  Filled 2017-08-25 (×2): qty 100

## 2017-08-25 MED ORDER — VITAMIN D 1000 UNITS PO TABS
1000.0000 [IU] | ORAL_TABLET | Freq: Every day | ORAL | Status: DC
  Administered 2017-08-25 – 2017-08-27 (×3): 1000 [IU] via ORAL
  Filled 2017-08-25 (×4): qty 1

## 2017-08-25 MED ORDER — INSULIN ASPART 100 UNIT/ML ~~LOC~~ SOLN
0.0000 [IU] | Freq: Three times a day (TID) | SUBCUTANEOUS | Status: DC
  Administered 2017-08-25 – 2017-08-26 (×2): 3 [IU] via SUBCUTANEOUS
  Administered 2017-08-26: 5 [IU] via SUBCUTANEOUS
  Administered 2017-08-26 – 2017-08-27 (×2): 3 [IU] via SUBCUTANEOUS
  Administered 2017-08-27: 2 [IU] via SUBCUTANEOUS

## 2017-08-25 MED ORDER — LIDOCAINE HCL (PF) 1 % IJ SOLN
INTRAMUSCULAR | Status: AC
Start: 2017-08-25 — End: ?
  Filled 2017-08-25: qty 30

## 2017-08-25 MED ORDER — ATORVASTATIN CALCIUM 20 MG PO TABS
20.0000 mg | ORAL_TABLET | Freq: Every day | ORAL | Status: DC
  Administered 2017-08-25 – 2017-08-27 (×3): 20 mg via ORAL
  Filled 2017-08-25 (×3): qty 1

## 2017-08-25 MED ORDER — POTASSIUM CHLORIDE 10 MEQ/50ML IV SOLN: 10.0000 meq | INTRAVENOUS | Status: AC

## 2017-08-25 MED ORDER — CARVEDILOL 25 MG PO TABS
25.0000 mg | ORAL_TABLET | Freq: Every day | ORAL | Status: DC
  Administered 2017-08-25 – 2017-08-27 (×3): 25 mg via ORAL
  Filled 2017-08-25 (×3): qty 1

## 2017-08-25 MED ORDER — OMEGA-3-ACID ETHYL ESTERS 1 G PO CAPS
1000.0000 mg | ORAL_CAPSULE | Freq: Two times a day (BID) | ORAL | Status: DC
  Administered 2017-08-25 – 2017-08-27 (×4): 1000 mg via ORAL
  Filled 2017-08-25 (×4): qty 1

## 2017-08-25 MED ORDER — NITROGLYCERIN IN D5W 200-5 MCG/ML-% IV SOLN: 0.0000 ug/min | INTRAVENOUS | Status: DC

## 2017-08-25 MED ORDER — HEPARIN SODIUM (PORCINE) 1000 UNIT/ML IJ SOLN
INTRAMUSCULAR | Status: DC | PRN
  Administered 2017-08-25: 9000 [IU] via INTRAVENOUS

## 2017-08-25 MED ORDER — PROTAMINE SULFATE 10 MG/ML IV SOLN
INTRAVENOUS | Status: DC | PRN
  Administered 2017-08-25: 10 mg via INTRAVENOUS
  Administered 2017-08-25: 80 mg via INTRAVENOUS

## 2017-08-25 MED ORDER — FOLIC ACID 0.5 MG HALF TAB
500.0000 ug | ORAL_TABLET | Freq: Every day | ORAL | Status: DC
  Administered 2017-08-25 – 2017-08-27 (×3): 0.5 mg via ORAL
  Filled 2017-08-25 (×3): qty 1

## 2017-08-25 MED ORDER — VANCOMYCIN HCL IN DEXTROSE 1-5 GM/200ML-% IV SOLN
1000.0000 mg | Freq: Once | INTRAVENOUS | Status: AC
  Administered 2017-08-25: 1000 mg via INTRAVENOUS
  Filled 2017-08-25: qty 200

## 2017-08-25 MED ORDER — SODIUM CHLORIDE 0.9% FLUSH: 3.0000 mL | INTRAVENOUS | Status: DC | PRN

## 2017-08-25 MED ORDER — 0.9 % SODIUM CHLORIDE (POUR BTL) OPTIME
TOPICAL | Status: DC | PRN
  Administered 2017-08-25: 4000 mL

## 2017-08-25 MED ORDER — CHLORHEXIDINE GLUCONATE 4 % EX LIQD: 30.0000 mL | CUTANEOUS | Status: DC

## 2017-08-25 MED ORDER — SODIUM CHLORIDE 0.9 % IV SOLN
0.0000 ug/min | INTRAVENOUS | Status: DC
  Filled 2017-08-25: qty 2

## 2017-08-25 MED ORDER — SODIUM CHLORIDE 0.9% FLUSH
3.0000 mL | Freq: Two times a day (BID) | INTRAVENOUS | Status: DC
  Administered 2017-08-25: 3 mL via INTRAVENOUS

## 2017-08-25 MED ORDER — LACTATED RINGERS IV SOLN: 500.0000 mL | Freq: Once | INTRAVENOUS | Status: DC | PRN

## 2017-08-25 MED ORDER — FENTANYL CITRATE (PF) 100 MCG/2ML IJ SOLN
INTRAMUSCULAR | Status: AC
  Administered 2017-08-25: 25 ug via INTRAVENOUS
  Filled 2017-08-25: qty 2

## 2017-08-25 MED ORDER — MORPHINE SULFATE (PF) 4 MG/ML IV SOLN: 2.0000 mg | INTRAVENOUS | Status: DC | PRN

## 2017-08-25 MED ORDER — FENTANYL CITRATE (PF) 100 MCG/2ML IJ SOLN: 50.0000 ug | Freq: Once | INTRAMUSCULAR | Status: DC

## 2017-08-25 MED ORDER — TRAMADOL HCL 50 MG PO TABS
50.0000 mg | ORAL_TABLET | ORAL | Status: DC | PRN
  Administered 2017-08-25: 50 mg via ORAL
  Administered 2017-08-26: 100 mg via ORAL
  Filled 2017-08-25: qty 1
  Filled 2017-08-25: qty 2

## 2017-08-25 MED ORDER — SODIUM CHLORIDE 0.9 % IV SOLN
INTRAVENOUS | Status: DC | PRN
  Administered 2017-08-25: 12:00:00 1500 mL

## 2017-08-25 MED ORDER — PANTOPRAZOLE SODIUM 40 MG PO TBEC: 40.0000 mg | DELAYED_RELEASE_TABLET | Freq: Every day | ORAL | Status: DC

## 2017-08-25 MED ORDER — IODIXANOL 320 MG/ML IV SOLN
INTRAVENOUS | Status: DC | PRN
  Administered 2017-08-25: 60.8 mL via INTRAVENOUS

## 2017-08-25 MED ORDER — SODIUM CHLORIDE 0.9 % IV SOLN
0.0125 ug/kg/min | INTRAVENOUS | Status: AC
  Administered 2017-08-25: .05 ug/kg/min via INTRAVENOUS
  Filled 2017-08-25: qty 2000

## 2017-08-25 MED ORDER — CINNAMON 500 MG PO CAPS: 500.0000 mg | ORAL_CAPSULE | Freq: Two times a day (BID) | ORAL | Status: DC

## 2017-08-25 SURGICAL SUPPLY — 112 items
ADAPTER UNIV SWAN GANZ BIP (ADAPTER) IMPLANT
ADAPTER UNV SWAN GANZ BIP (ADAPTER)
ATTRACTOMAT 16X20 MAGNETIC DRP (DRAPES) IMPLANT
BAG BANDED W/RUBBER/TAPE 36X54 (MISCELLANEOUS) ×4 IMPLANT
BAG DECANTER FOR FLEXI CONT (MISCELLANEOUS) IMPLANT
BAG SNAP BAND KOVER 36X36 (MISCELLANEOUS) ×8 IMPLANT
BLADE 10 SAFETY STRL DISP (BLADE) IMPLANT
BLADE CLIPPER SURG (BLADE) IMPLANT
BLADE STERNUM SYSTEM 6 (BLADE) IMPLANT
CABLE ADAPT CONN TEMP 6FT (ADAPTER) ×4 IMPLANT
CABLE PACING FASLOC BIEGE (MISCELLANEOUS) ×4 IMPLANT
CABLE PACING FASLOC BLUE (MISCELLANEOUS) IMPLANT
CANISTER SUCT 3000ML PPV (MISCELLANEOUS) IMPLANT
CANNULA FEM VENOUS REMOTE 22FR (CANNULA) IMPLANT
CANNULA OPTISITE PERFUSION 16F (CANNULA) IMPLANT
CANNULA OPTISITE PERFUSION 18F (CANNULA) IMPLANT
CATH DIAG EXPO 6F VENT PIG 145 (CATHETERS) ×8 IMPLANT
CATH EXPO 5FR AL1 (CATHETERS) ×8 IMPLANT
CATH INFINITI 6F AL2 (CATHETERS) ×4 IMPLANT
CATH S G BIP PACING (SET/KITS/TRAYS/PACK) ×8 IMPLANT
CATH TEMPO TEMP PACE LEAD CRVE (CATHETERS) ×4 IMPLANT
CLIP VESOCCLUDE MED 24/CT (CLIP) IMPLANT
CLIP VESOCCLUDE SM WIDE 24/CT (CLIP) IMPLANT
CONT SPEC 4OZ CLIKSEAL STRL BL (MISCELLANEOUS) ×8 IMPLANT
COVER BACK TABLE 60X90IN (DRAPES) ×4 IMPLANT
COVER BACK TABLE 80X110 HD (DRAPES) ×4 IMPLANT
COVER DOME SNAP 22 D (MISCELLANEOUS) ×4 IMPLANT
COVER MAYO STAND STRL (DRAPES) IMPLANT
CRADLE DONUT ADULT HEAD (MISCELLANEOUS) ×4 IMPLANT
DERMABOND ADVANCED (GAUZE/BANDAGES/DRESSINGS) ×2
DERMABOND ADVANCED .7 DNX12 (GAUZE/BANDAGES/DRESSINGS) ×2 IMPLANT
DEVICE CLOSURE PERCLS PRGLD 6F (VASCULAR PRODUCTS) ×6 IMPLANT
DRAPE INCISE IOBAN 66X45 STRL (DRAPES) IMPLANT
DRAPE SLUSH MACHINE 52X66 (DRAPES) ×4 IMPLANT
DRSG TEGADERM 4X4.75 (GAUZE/BANDAGES/DRESSINGS) ×4 IMPLANT
DRYSEAL FLEXSHEATH 20FR 33CM (SHEATH) ×2
ELECT REM PT RETURN 9FT ADLT (ELECTROSURGICAL) ×8
ELECTRODE REM PT RTRN 9FT ADLT (ELECTROSURGICAL) ×4 IMPLANT
FELT TEFLON 6X6 (MISCELLANEOUS) IMPLANT
FEMORAL VENOUS CANN RAP (CANNULA) IMPLANT
GAUZE SPONGE 4X4 12PLY STRL (GAUZE/BANDAGES/DRESSINGS) ×4 IMPLANT
GLOVE BIO SURGEON STRL SZ7.5 (GLOVE) IMPLANT
GLOVE BIO SURGEON STRL SZ8 (GLOVE) ×4 IMPLANT
GLOVE EUDERMIC 7 POWDERFREE (GLOVE) ×4 IMPLANT
GLOVE ORTHO TXT STRL SZ7.5 (GLOVE) IMPLANT
GOWN STRL REUS W/ TWL LRG LVL3 (GOWN DISPOSABLE) ×6 IMPLANT
GOWN STRL REUS W/ TWL XL LVL3 (GOWN DISPOSABLE) ×6 IMPLANT
GOWN STRL REUS W/TWL LRG LVL3 (GOWN DISPOSABLE) ×6
GOWN STRL REUS W/TWL XL LVL3 (GOWN DISPOSABLE) ×6
GUIDEWIRE CNFDA BRKR CVD (WIRE) ×4 IMPLANT
GUIDEWIRE SAF TJ AMPL .035X180 (WIRE) ×4 IMPLANT
GUIDEWIRE SAFE TJ AMPLATZ EXST (WIRE) ×4 IMPLANT
GUIDEWIRE STRAIGHT .035 260CM (WIRE) ×4 IMPLANT
INSERT FOGARTY 61MM (MISCELLANEOUS) IMPLANT
INSERT FOGARTY SM (MISCELLANEOUS) IMPLANT
INSERT FOGARTY XLG (MISCELLANEOUS) IMPLANT
KIT BASIN OR (CUSTOM PROCEDURE TRAY) ×4 IMPLANT
KIT DILATOR VASC 18G NDL (KITS) ×4 IMPLANT
KIT HEART LEFT (KITS) ×4 IMPLANT
KIT ROOM TURNOVER OR (KITS) ×4 IMPLANT
KIT SUCTION CATH 14FR (SUCTIONS) IMPLANT
NEEDLE PERC 18GX7CM (NEEDLE) ×4 IMPLANT
NS IRRIG 1000ML POUR BTL (IV SOLUTION) ×16 IMPLANT
PACK AORTA (CUSTOM PROCEDURE TRAY) ×4 IMPLANT
PAD ARMBOARD 7.5X6 YLW CONV (MISCELLANEOUS) ×8 IMPLANT
PAD ELECT DEFIB RADIOL ZOLL (MISCELLANEOUS) ×4 IMPLANT
PATCH TACHOSII LRG 9.5X4.8 (VASCULAR PRODUCTS) IMPLANT
PERCLOSE PROGLIDE 6F (VASCULAR PRODUCTS) ×12
SET MICROPUNCTURE 5F STIFF (MISCELLANEOUS) ×4 IMPLANT
SHEATH AVANTI 11CM 8FR (MISCELLANEOUS) ×4 IMPLANT
SHEATH DRYSEAL FLEX 20FR 33CM (SHEATH) ×2 IMPLANT
SHEATH PINNACLE 6F 10CM (SHEATH) ×8 IMPLANT
SHEATH PINNACLE 8F 10CM (SHEATH) ×4 IMPLANT
SLEEVE REPOSITIONING LENGTH 30 (MISCELLANEOUS) ×4 IMPLANT
SPONGE LAP 4X18 X RAY DECT (DISPOSABLE) IMPLANT
STOPCOCK MORSE 400PSI 3WAY (MISCELLANEOUS) ×4 IMPLANT
SUT ETHIBOND X763 2 0 SH 1 (SUTURE) IMPLANT
SUT GORETEX CV 4 TH 22 36 (SUTURE) IMPLANT
SUT GORETEX CV4 TH-18 (SUTURE) IMPLANT
SUT GORETEX TH-18 36 INCH (SUTURE) IMPLANT
SUT MNCRL AB 3-0 PS2 18 (SUTURE) IMPLANT
SUT PROLENE 3 0 SH1 36 (SUTURE) IMPLANT
SUT PROLENE 4 0 RB 1 (SUTURE)
SUT PROLENE 4-0 RB1 .5 CRCL 36 (SUTURE) IMPLANT
SUT PROLENE 5 0 C 1 36 (SUTURE) IMPLANT
SUT PROLENE 6 0 C 1 30 (SUTURE) IMPLANT
SUT SILK  1 MH (SUTURE) ×2
SUT SILK 1 MH (SUTURE) ×2 IMPLANT
SUT SILK 2 0 SH CR/8 (SUTURE) IMPLANT
SUT VIC AB 2-0 CT1 27 (SUTURE)
SUT VIC AB 2-0 CT1 TAPERPNT 27 (SUTURE) IMPLANT
SUT VIC AB 2-0 CTX 36 (SUTURE) IMPLANT
SUT VIC AB 3-0 SH 8-18 (SUTURE) IMPLANT
SYR 10ML LL (SYRINGE) ×12 IMPLANT
SYR 30ML LL (SYRINGE) ×8 IMPLANT
SYR 50ML LL SCALE MARK (SYRINGE) ×4 IMPLANT
SYS DELIVERY ENVEO PRO 16FR (MISCELLANEOUS) ×4
SYS LOAD ENVEOPRO 26/29MMX34MM (MISCELLANEOUS) ×4
SYSTEM DELIVERY ENVEO PRO 16FR (MISCELLANEOUS) ×2 IMPLANT
SYSTEM LOAD ENVPR 26/29MMX34MM (MISCELLANEOUS) ×2 IMPLANT
TOWEL OR 17X26 10 PK STRL BLUE (TOWEL DISPOSABLE) ×8 IMPLANT
TRANSDUCER W/STOPCOCK (MISCELLANEOUS) ×8 IMPLANT
TRAY FOLEY SILVER 14FR TEMP (SET/KITS/TRAYS/PACK) IMPLANT
TUBE SUCT INTRACARD DLP 20F (MISCELLANEOUS) IMPLANT
TUBING ART PRESS 72  MALE/FEM (TUBING) ×2
TUBING ART PRESS 72 MALE/FEM (TUBING) ×2 IMPLANT
TUBING HIGH PRESSURE 120CM (CONNECTOR) ×4 IMPLANT
VALVE AORTIC EVOLUT PRO 26MM (Prosthesis & Implant Heart) ×4 IMPLANT
WIRE .035 3MM-J 145CM (WIRE) ×4 IMPLANT
WIRE AMPLATZ SS-J .035X180CM (WIRE) ×4 IMPLANT
WIRE BENTSON .035X145CM (WIRE) ×4 IMPLANT
WIRE EMERALD 3MM-J .025X260CM (WIRE) ×4 IMPLANT

## 2017-08-25 NOTE — Transfer of Care (Signed)
Immediate Anesthesia Transfer of Care Note  Patient: Kristina Morrison  Procedure(s) Performed: TRANSCATHETER AORTIC VALVE REPLACEMENT, TRANSFEMORAL (N/A Chest) TRANSESOPHAGEAL ECHOCARDIOGRAM (TEE) (N/A )  Patient Location: SICU  Anesthesia Type:MAC  Level of Consciousness: drowsy  Airway & Oxygen Therapy: Patient Spontanous Breathing and Patient connected to nasal cannula oxygen  Post-op Assessment: Report given to RN and Post -op Vital signs reviewed and stable  Post vital signs: Reviewed and stable  Last Vitals:  Vitals:   08/25/17 1302 08/25/17 1306  BP:    Pulse: (!) 168 (!) 169  Resp:    Temp:    SpO2:      Last Pain:  Vitals:   08/25/17 0855  TempSrc: Oral      Patients Stated Pain Goal: 4 (75/91/63 [id number])  Complications: No apparent anesthesia complications

## 2017-08-25 NOTE — Anesthesia Procedure Notes (Signed)
Central Venous Catheter Insertion Performed by: Shelton SilvasHollis, Alias Villagran D, MD, anesthesiologist Start/End2/26/2019 9:45 AM, 08/25/2017 9:55 AM Patient location: Pre-op. Preanesthetic checklist: patient identified, IV checked, site marked, risks and benefits discussed, surgical consent, monitors and equipment checked, pre-op evaluation, timeout performed and anesthesia consent Position: Trendelenburg Lidocaine 1% used for infiltration and patient sedated Hand hygiene performed , maximum sterile barriers used  and Seldinger technique used Catheter size: 8 Fr Total catheter length 16. Central line was placed.Double lumen Procedure performed using ultrasound guided technique. Ultrasound Notes:anatomy identified, needle tip was noted to be adjacent to the nerve/plexus identified, no ultrasound evidence of intravascular and/or intraneural injection and image(s) printed for medical record Attempts: 1 Following insertion, dressing applied, line sutured and Biopatch. Post procedure assessment: blood return through all ports  Patient tolerated the procedure well with no immediate complications.

## 2017-08-25 NOTE — Progress Notes (Signed)
PM Round:  Pt doing well post-TAVR. No complaints.  BL groin sites clear.  BP/rhythm stable with PVC's  Plan: DC art line, mobilize in am and transfer out to tele bed, routine post-op  Tonny BollmanMichael Marieme Mcmackin 08/25/2017 6:43 PM

## 2017-08-25 NOTE — Op Note (Signed)
[facility]   MULTIDISCIPLINARY HEART VALVE TEAM   TAVR OPERATIVE NOTE   Date of Procedure:  08/25/2017  Preoperative Diagnosis: Severe Aortic Stenosis   Postoperative Diagnosis: Same   Procedure:    Transcatheter Aortic Valve Replacement - Percutaneous  Transfemoral Approach  Medtronic Evolut Pro THV (size 26 mm, serial # [id number])   Co-Surgeons:  Alleen Borne, MD and Tonny Bollman, MD  Anesthesiologist:  Shona Simpson, MD  Echocardiographer:  Charlton Haws, MD  Pre-operative Echo Findings:  Severe aortic stenosis  Normal left ventricular systolic function  Post-operative Echo Findings:  No paravalvular leak  Unchanged left ventricular systolic function  BRIEF CLINICAL NOTE AND INDICATIONS FOR SURGERY  Please see the full operative note of Dr Laneta Simmers for details of the patient's history and TAVR indications.   During the course of the patient's preoperative work up they have been evaluated comprehensively by a multidisciplinary team of specialists coordinated through the Multidisciplinary Heart Valve Clinic in the [facility].  They have been demonstrated to suffer from symptomatic severe aortic stenosis as noted above. The patient has been counseled extensively as to the relative risks and benefits of all options for the treatment of severe aortic stenosis including long term medical therapy, conventional surgery for aortic valve replacement, and transcatheter aortic valve replacement.  The patient has been independently evaluated by two cardiac surgeons including Dr. Cornelius Moras and Dr. Laneta Simmers. Based upon review of all of the patient's preoperative diagnostic tests they are felt to be candidate for transcatheter aortic valve replacement using the transfemoral approach as an alternative to high risk conventional surgery.    Following the decision to proceed with transcatheter aortic valve replacement, a discussion has been held regarding  what types of management strategies would be attempted intraoperatively in the event of life-threatening complications, including whether or not the patient would be considered a candidate for the use of cardiopulmonary bypass and/or conversion to open sternotomy for attempted surgical intervention.  The patient has been advised of a variety of complications that might develop peculiar to this approach including but not limited to risks of death, stroke, paravalvular leak, aortic dissection or other major vascular complications, aortic annulus rupture, device embolization, cardiac rupture or perforation, acute myocardial infarction, arrhythmia, heart block or bradycardia requiring permanent pacemaker placement, congestive heart failure, respiratory failure, renal failure, pneumonia, infection, other late complications related to structural valve deterioration or migration, or other complications that might ultimately cause a temporary or permanent loss of functional independence or other long term morbidity.  The patient provides full informed consent for the procedure as described and all questions were answered preoperatively.  DETAILS OF THE OPERATIVE PROCEDURE  PREPARATION:   The patient is brought to the operating room on the above mentioned date and central monitoring was established by the anesthesia team including placement of a central venous catheter and radial arterial line. The patient is placed in the supine position on the operating table.  Intravenous antibiotics are administered. The patient is monitored closely throughout the procedure under conscious sedation.  Baseline transthoracic echocardiogram is performed. The patient's chest, abdomen, both groins, and both lower extremities are prepared and draped in a sterile manner. A time out procedure is performed.   PERIPHERAL ACCESS:   Using ultrasound guidance, femoral arterial and venous access is obtained with placement of 6 Fr sheaths on  the left side.  A pigtail diagnostic catheter was passed through the femoral arterial sheath under fluoroscopic guidance into the  aortic root.  A temporary transvenous pacemaker catheter was passed through the femoral venous sheath under fluoroscopic guidance into the right ventricle.  The pacemaker was tested to ensure stable lead placement and pacemaker capture. Aortic root angiography was performed in order to determine the optimal angiographic angle for valve deployment.  TRANSFEMORAL ACCESS: Right A micropuncture technique is used to access the right femoral artery under fluoroscopic and ultrasound guidance.  2 Perclose devices are deployed at 10' and 2' positions to 'PreClose' the femoral artery.  The first 2 Perclose devices failed and the vessel was rewired, then 2 Perclose devices were successfully deployed.  An 8 French sheath is placed and then an Amplatz Superstiff wire is advanced through the sheath. This is changed out for a 20 Fr Dry Seal sheath  after progressively dilating over the Superstiff wire.  An AL-2 catheter was used to direct a straight-tip exchange length wire across the native aortic valve into the left ventricle. This was exchanged out for a pigtail catheter and position was confirmed in the LV apex. Simultaneous LV and Ao pressures were recorded.  The pigtail catheter was exchanged for an Confida wire in the LV apex.  Echocardiography was utilized to confirm appropriate wire position and no sign of entanglement in the mitral subvalvular apparatus.  TRANSCATHETER HEART VALVE DEPLOYMENT:  Please see the complete note of Dr Laneta SimmersBartle for description of valve deployment.  The valve is assessed fluoroscopically and by echo after deployment and function is felt to be normal with no paravalvular regurgitation and a mean transvalvular gradient of 8 mmHg.  PROCEDURE COMPLETION:  The sheath was removed and femoral artery closure is performed using the 2 previously deployed Perclose devices.   Protamine is administered once femoral arterial repair was complete. The site is clear with no evidence of bleeding or hematoma after the sutures are tightened. The temporary pacemaker, pigtail catheters and femoral sheaths were removed with manual pressure used for hemostasis.   The patient tolerated the procedure well and is transported to the surgical intensive care in stable condition. There were no immediate intraoperative complications. All sponge instrument and needle counts are verified correct at completion of the operation.   The patient received a total of 60 mL of intravenous contrast during the procedure.   Tonny BollmanMichael Sevan Mcbroom, MD 08/25/2017 5:25 PM

## 2017-08-25 NOTE — Progress Notes (Signed)
  Echocardiogram 2D Echocardiogram has been performed.  Pieter PartridgeBrooke S Kabir Brannock 08/25/2017, 11:22 AM

## 2017-08-25 NOTE — Anesthesia Postprocedure Evaluation (Addendum)
Anesthesia Post Note  Patient: Kristina Morrison  Procedure(s) Performed: TRANSCATHETER AORTIC VALVE REPLACEMENT, TRANSFEMORAL (N/A Chest) TRANSESOPHAGEAL ECHOCARDIOGRAM (TEE) (N/A )     Patient location during evaluation: PACU Anesthesia Type: MAC Level of consciousness: awake and alert Pain management: pain level controlled Vital Signs Assessment: post-procedure vital signs reviewed and stable Respiratory status: spontaneous breathing, nonlabored ventilation, respiratory function stable and patient connected to nasal cannula oxygen Cardiovascular status: stable and blood pressure returned to baseline Postop Assessment: no apparent nausea or vomiting Anesthetic complications: no    Last Vitals:  Vitals:   08/25/17 1505 08/25/17 1510  BP:    Pulse: (!) 56 (!) 58  Resp: 17 (!) 21  Temp:    SpO2: 100% 100%    Last Pain:  Vitals:   08/25/17 0855  TempSrc: Oral                 Effie Berkshire

## 2017-08-25 NOTE — Op Note (Signed)
[facility]   MULTIDISCIPLINARY HEART VALVE TEAM   TAVR OPERATIVE NOTE    Kristina Morrison [id number]   Date of Procedure:  08/25/2017  Preoperative Diagnosis: Severe Aortic Stenosis   Postoperative Diagnosis: Same   Procedure:    Transcatheter Aortic Valve Replacement - Percutaneous Right Transfemoral Approach  Medtronic CoreValve Evolut Pro (size 26 mm,  serial # [id number])   Co-Surgeons:  Alleen BorneBryan K. Xayden Linsey, MD and Tonny BollmanMichael Cooper, MD   Anesthesiologist:  Shona SimpsonKevin Hollis, MD  Echocardiographer:  Charlton HawsPeter Nishan, MD  Pre-operative Echo Findings:  Severe aortic stenosis  Normal left ventricular systolic function  Post-operative Echo Findings:  no paravalvular leak  Normal left ventricular systolic function   BRIEF CLINICAL NOTE AND INDICATIONS FOR SURGERY  This 84 year old woman has stage D, moderate-severe, symptomatic aortic stenosis with New York Heart Association class II symptoms of progressive exertional shortness of breath and fatigue consistent with chronic diastolic congestive heart failure. In addition she has had some lightheadedness, chest tightness and cold sweat sensation with activity. I have personally reviewed her recent echocardiogram, cardiac catheterization, and CTA studies. Her echocardiogram shows a trileaflet aortic valve with moderately calcified and thickened leaflets with restricted mobility and a mean transvalvular gradient of 30 mmHg. She has normal left ventricular systolic function. Her mean transvalvular gradient at catheterization was 27 mmHg with a calculated aortic valve area of 1.1 cm. There is mild nonobstructive coronary disease. Although her mean transvalvular gradient is still in the moderate range her valve certainly looks severely stenotic and her symptoms are consistent with progressive aortic stenosis.  I think transcatheter aortic valve replacement would be a reasonable alternative for this patient. Her gated cardiac CTA  shows anatomy favorable for TAVR. Her abdominal and pelvic CTA shows anatomy suitable for transfemoral insertion.  The patient and her daughter were counseled at length regarding treatment alternatives for management of severe symptomatic aortic stenosis. The risks and benefits of surgical intervention has been discussed in detail. Long-term prognosis with medical therapy was discussed. Alternative approaches such as conventional surgical aortic valve replacement, transcatheter aortic valve replacement, and palliative medical therapy were compared and contrasted at length. This discussion was placed in the context of the patient's own specific clinical presentation and past medical history. All of their questions have been addressed.   The patient has been advised of a variety of complications that might develop with transcatheter aortic valve replacement including but not limited to risks of death, stroke, paravalvular leak, aortic dissection or other major vascular complications, aortic annulus rupture, device embolization, cardiac rupture or perforation, mitral regurgitation, acute myocardial infarction, arrhythmia, heart block or bradycardia requiring permanent pacemaker placement, congestive heart failure, respiratory failure, renal failure, pneumonia, infection, other late complications related to structural valve deterioration or migration, or other complications that might ultimately cause a temporary or permanent loss of functional independence or other long term morbidity. The patient provides full informed consent for the procedure as described if we decide that she is a candidate for transcatheter aortic valve replacement. All of their questions were answered.      DETAILS OF THE OPERATIVE PROCEDURE  PREPARATION:    The patient is brought to the operating room on the above mentioned date and central monitoring was established by the anesthesia team including placement of a central venous  line and radial arterial line. The patient is placed in the supine position on the operating table.  Intravenous antibiotics are administered.  General endotracheal anesthesia is induced uneventfully. A Foley catheter  is placed.  Baseline transesophageal echocardiogram was performed. The patient's chest, abdomen, both groins, and both lower extremities are prepared and draped in a sterile manner. A time out procedure is performed.   PERIPHERAL ACCESS:    Using the modified Seldinger technique, femoral arterial and venous access was obtained with placement of 6 Fr sheaths on the left side.  A pigtail diagnostic catheter was passed through the left arterial sheath under fluoroscopic guidance into the aortic root.  A temporary transvenous pacemaker catheter was passed through the left femoral venous sheath under fluoroscopic guidance into the right ventricle.  The pacemaker was tested to ensure stable lead placement and pacemaker capture.   TRANSFEMORAL ACCESS:   Percutaneous transfemoral access and sheath placement was performed by Dr. Excell Seltzer using ultrasound guidance.  The right common femoral artery was cannulated using a micropuncture needle and appropriate location was verified using hand injection angiogram.  A pair of Abbott Perclose percutaneous closure devices were placed and a 8 French sheath replaced into the femoral artery.  The patient was heparinized systemically and ACT verified > 250 seconds.    A 20 Fr Dry-Seal sheath was introduced into the right femoral artery after progressively dilating over an Amplatz superstiff wire. An AL-2 catheter was used to direct a straight-tip exchange length wire across the native aortic valve into the left ventricle. This was exchanged out for a pigtail catheter and position was confirmed in the LV apex. Simultaneous LV and Ao pressures were recorded.  The pigtail catheter was exchanged for a Confida wire in the LV apex.  Echocardiography was utilized to  confirm appropriate wire position and no sign of entanglement in the mitral subvalvular apparatus.   BALLOON AORTIC VALVULOPLASTY:   Not performed   TRANSCATHETER HEART VALVE DEPLOYMENT:   A Medtronic Evolut Pro transcatheter heart valve (size 26 mm, serial #[id number]) was prepared and crimped per manufacturer's guidelines, and the proper orientation of the valve is confirmed on the Medtronic EnVeo Pro delivery system. The valve was advanced through the introducer sheath using normal technique until in an appropriate position in the abdominal aorta beyond the sheath tip. The valve was then advanced across the aortic arch. The valve was carefully positioned across the aortic valve annulus. The paralax was removed from the delivery system by moving the detector into the LAO postion.  The valve is carefully deployed using the retractor dial so that 80% of the valve is released. TEE demonstrated no paravalvular AI and the patient's heart rhythm was stable. The valve was fully released during pacing at 160 bpm. There was felt to be no paravalvular leak and no central aortic insufficiency. The patient's hemodynamic recovery following valve deployment is good.  The delivery catheter and guidewire were both removed.    PROCEDURE COMPLETION:   The sheath was removed and femoral artery closure performed by Dr Excell Seltzer.  Protamine was administered once femoral arterial repair was complete. The temporary pacemaker, pigtail catheters and femoral sheaths were removed with manual pressure used for hemostasis.   The patient tolerated the procedure well and is transported to the surgical intensive care in stable condition. There were no immediate intraoperative complications. All sponge instrument and needle counts are verified correct at completion of the operation.   No blood products were administered during the operation.  The patient received a total of 60.8 mL of intravenous contrast during the  procedure.   Alleen Borne, MD

## 2017-08-25 NOTE — Progress Notes (Signed)
Arterial and venous sheaths removed from left groin per protocol. Pressure held X 20 min. Groin site Level 0, +2 DPP, vital signs stable.

## 2017-08-25 NOTE — Anesthesia Preprocedure Evaluation (Addendum)
Anesthesia Evaluation  Patient identified by MRN, date of birth, ID band Patient awake    Reviewed: Allergy & Precautions, NPO status , Patient's Chart, lab work & pertinent test results  Airway Mallampati: I  TM Distance: >3 FB Neck ROM: Full    Dental  (+) Teeth Intact, Dental Advisory Given   Pulmonary former smoker,    breath sounds clear to auscultation       Cardiovascular hypertension, Pt. on medications and Pt. on home beta blockers + CAD  + dysrhythmias Atrial Fibrillation + Valvular Problems/Murmurs AS  Rhythm:Regular Rate:Normal + Systolic murmurs    Neuro/Psych  Neuromuscular disease    GI/Hepatic GERD  Medicated,  Endo/Other  diabetes, Type 2, Oral Hypoglycemic AgentsHypothyroidism   Renal/GU      Musculoskeletal  (+) Arthritis ,   Abdominal Normal abdominal exam  (+)   Peds  Hematology negative hematology ROS (+)   Anesthesia Other Findings - HLD  Reproductive/Obstetrics                           Lab Results  Component Value Date   WBC 8.4 08/21/2017   HGB 10.3 (L) 08/21/2017   HCT 33.2 (L) 08/21/2017   MCV 94.3 08/21/2017   PLT 157 08/21/2017   Lab Results  Component Value Date   CREATININE 0.85 08/21/2017   BUN 11 08/21/2017   NA 139 08/21/2017   K 4.7 08/21/2017   CL 109 08/21/2017   CO2 20 (L) 08/21/2017   Lab Results  Component Value Date   INR 1.05 08/21/2017   INR 1.1 08/06/2017   EKG: sinus bradycardia, 1st degree AV block.  Echo: - Left ventricle: The cavity size was normal. Systolic function was   normal. The estimated ejection fraction was in the range of 60% to 65%. Wall motion was normal; there were no regional wall motion abnormalities. Doppler parameters are consistent with   abnormal left ventricular relaxation (grade 1 diastolic   dysfunction). Doppler parameters are consistent with high   ventricular filling pressure. - Aortic valve: Valve  mobility was restricted. There was moderate   stenosis. There was mild regurgitation. Valve area (VTI): 1.34   cm^2. Valve area (Vmax): 1.26 cm^2. Valve area (Vmean): 1.22   cm^2. - Mitral valve: Calcified annulus. Transvalvular velocity was   within the normal range. There was no evidence for stenosis.   There was mild regurgitation. - Left atrium: The atrium was mildly dilated. - Right ventricle: The cavity size was normal. Wall thickness was   normal. Systolic function was normal. - Tricuspid valve: There was trivial regurgitation. - Pulmonary arteries: Systolic pressure was within the normal   range. PA peak pressure: 29 mm Hg (S).    Anesthesia Physical Anesthesia Plan  ASA: IV  Anesthesia Plan: MAC   Post-op Pain Management:    Induction: Intravenous  PONV Risk Score and Plan: 4 or greater and Treatment may vary due to age or medical condition, Ondansetron and TIVA  Airway Management Planned: Simple Face Mask  Additional Equipment: Arterial line, CVP and Ultrasound Guidance Line Placement  Intra-op Plan:   Post-operative Plan: Extubation in OR  Informed Consent: I have reviewed the patients History and Physical, chart, labs and discussed the procedure including the risks, benefits and alternatives for the proposed anesthesia with the patient or authorized representative who has indicated his/her understanding and acceptance.   Dental advisory given  Plan Discussed with: CRNA  Anesthesia Plan Comments: (Propofol,  Remi gtt)      Anesthesia Quick Evaluation

## 2017-08-25 NOTE — Anesthesia Procedure Notes (Signed)
Arterial Line Insertion Start/End2/26/2019 9:40 AM, 08/25/2017 9:46 AM Performed by: Rachel MouldsLee, Heather B, CRNA, CRNA  Patient location: Pre-op. Preanesthetic checklist: patient identified, IV checked, site marked, risks and benefits discussed, surgical consent, monitors and equipment checked, pre-op evaluation and timeout performed Lidocaine 1% used for infiltration and patient sedated Right, radial was placed Catheter size: 20 G Hand hygiene performed  and maximum sterile barriers used  Allen's test indicative of satisfactory collateral circulation Attempts: 1 Procedure performed without using ultrasound guided technique. Following insertion, Biopatch. Post procedure assessment: normal  Patient tolerated the procedure well with no immediate complications.

## 2017-08-25 NOTE — H&P (Signed)
[address]       [name] [address]             [phone]      Cardiothoracic Surgery Admission History and Physical   Referring Provider is Othella Boyer, MD  PCP is [facility]      Chief Complaint  Patient presents with  . 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 Her atrial fibrillation has been managed with rhythm control using Multaq and anticoagulation with Eliquis. The patient lives in [place], [place] and has been followed by a cardiologist there. This past fall she reported shortness of breath with walking up her driveway and a follow-up echocardiogram showed severe aortic stenosis. She was told that she should consider TAVR and since her daughter lives here in new the medical community she decided to come to [facility] for evaluation. She was primarily limited by osteoarthritis at that point and Dr. Donnie Aho recommended continued surveillance. She returned to [place] but has noted progressive shortness of breath occurring with activity such as walking on flat ground or moving around in her house. She has had some lightheadedness but no syncope. She has noted some fullness in her chest with activity. She has also noted a cold sweat with activity. She denies any symptoms at rest and has had no PND orthopnea. She has had no peripheral edema. She recently returned to [facility] and was evaluated by Dr. Excell Seltzer. A follow-up echocardiogram showed a trileaflet aortic valve with moderately thickened and calcified leaflets. The mean gradient was 30 mmHg with a peak gradient of 50 mmHg. The dimensionless index was 0.4. Left ventricular ejection fraction was 60-65% with grade 1  diastolic dysfunction. There was mild mitral regurgitation. She underwent right and left heart catheterization on 08/11/2017. This showed mild nonobstructive coronary disease with normal right heart pressures. The mean transvalvular gradient was 27 mmHg with a calculated aortic valve area of 1.1 cm. The valve was difficult to cross.      Past Medical History:  Diagnosis Date  . Aortic valve stenosis   . Carpal tunnel syndrome   . H/O hernia repair   . Hyperlipidemia   . Hypertensive heart disease without heart failure   . Hypothyroidism   . Long term (current) use of anticoagulants   . Lumbar disc disease   . Obesity   . Paroxysmal atrial fibrillation (HCC)   . SOB (shortness of breath)         Past Surgical History:  Procedure Laterality Date  . ABDOMINAL HYSTERECTOMY    . BREAST BIOPSY    . CARPAL TUNNEL RELEASE    . CERVICAL FUSION    . CHOLECYSTECTOMY    . LUMBAR LAMINECTOMY    . REPLACEMENT TOTAL KNEE Bilateral   . RIGHT/LEFT HEART CATH AND CORONARY ANGIOGRAPHY N/A 08/11/2017   Procedure: RIGHT/LEFT HEART CATH AND CORONARY ANGIOGRAPHY; Surgeon: Tonny Bollman, MD; Location: [facility] INVASIVE CV LAB; Service: Cardiovascular; Laterality: N/A;        Family History  Problem Relation Age of Onset  . CVA Mother   . Heart attack Father   . Breast cancer Sister    Social History  Socioeconomic History  . Marital status: Single    Spouse name: Not on file  . Number of children: Not on file  . Years of education: Not on file  . Highest education level: Not on file  Social Needs  . Financial resource strain: Not on file  . Food insecurity - worry: Not on file  . Food insecurity - inability: Not on file  . Transportation needs - medical: Not on file  . Transportation needs - non-medical: Not on file  Occupational History  . Occupation: home maker  Tobacco Use  . Smoking status: Former Smoker    Last attempt to quit: 08/05/1958    Years since quitting: 59.0  .  Smokeless tobacco: Never Used  Substance and Sexual Activity  . Alcohol use: Yes  . Drug use: No  . Sexual activity: Not on file  Other Topics Concern  . Not on file  Social History Narrative  . Not on file         Current Outpatient Medications  Medication Sig Dispense Refill  . acetaminophen (TYLENOL 8 HOUR ARTHRITIS PAIN) 650 MG CR tablet Take 650 mg by mouth every 8 (eight) hours as needed for pain.    Marland Kitchen. apixaban (ELIQUIS) 5 MG TABS tablet Take 5 mg by mouth 2 (two) times daily.    Marland Kitchen. atorvastatin (LIPITOR) 20 MG tablet Take 20 mg by mouth daily.     . carvedilol (COREG) 25 MG tablet Take 25 mg by mouth daily.     . Cholecalciferol (VITAMIN D3) 1000 units CAPS Take 1,000 Units by mouth daily.     . Cinnamon 500 MG capsule Take 500 mg by mouth 2 (two) times daily.    Marland Kitchen. dronedarone (MULTAQ) 400 MG tablet Take 400 mg by mouth 2 (two) times daily with a meal.    . famotidine (PEPCID) 20 MG tablet Take 20 mg by mouth at bedtime.     . folic acid (FOLVITE) 400 MCG tablet Take 400 mcg by mouth daily.    . fosinopril (MONOPRIL) 20 MG tablet Take 20 mg by mouth at bedtime.     Marland Kitchen. glimepiride (AMARYL) 1 MG tablet Take 1 mg by mouth daily with breakfast.   0  . levothyroxine (SYNTHROID, LEVOTHROID) 50 MCG tablet Take 50 mcg by mouth daily before breakfast.    . Multiple Vitamin (MULTIVITAMIN WITH MINERALS) TABS tablet Take 1 tablet by mouth daily.    . Omega-3 Fatty Acids (FISH OIL) 1000 MG CAPS Take 1,000 mg by mouth 2 (two) times daily.     . predniSONE (DELTASONE) 10 MG tablet Take 5 mg by mouth daily.   2  . vitamin C (ASCORBIC ACID) 500 MG tablet Take 500 mg by mouth 2 (two) times daily.     No current facility-administered medications for this visit.         Allergies  Allergen Reactions  . Cinobac [Cinoxacin] Swelling    Tongue swells.  . Penicillins Hives and Itching    Has patient had a PCN reaction causing immediate rash, facial/tongue/throat swelling, SOB or lightheadedness  with hypotension: Yes  Has patient had a PCN reaction causing severe rash involving mucus membranes or skin necrosis: No  Has patient had a PCN reaction that required hospitalization: No  Has patient had a PCN reaction occurring within the last 10 years: Yes  If all of the above answers are "NO", then may proceed with Cephalosporin use.   . Sulfa Antibiotics Other (See Comments)  Unsure of reaction type   Review of Systems:  General: normal appetite, decreased energy, no weight gain, no weight loss, no fever  Cardiac: has chest pain with exertion, no chest pain at rest, has SOB with mild exertion, no resting SOB, no PND, no orthopnea, no palpitations, has arrhythmia, has atrial fibrillation, no LE edema, has dizzy spells, no syncope  Respiratory: exertional shortness of breath, no home oxygen, no productive cough, no dry cough, no bronchitis, no wheezing, no hemoptysis, no asthma, no pain with inspiration or cough, no sleep apnea, no CPAP at night  GI: no difficulty swallowing, no reflux, no frequent heartburn, no hiatal hernia, no abdominal pain, no constipation, no diarrhea, no hematochezia, no hematemesis, no melena  GU: no dysuria, no frequency, had urinary tract infection 06/2017, no hematuria, no kidney stones, no kidney disease  Vascular: no pain suggestive of claudication, no pain in feet, no leg cramps, no varicose veins, no DVT, no non-healing foot ulcer  Neuro: no stroke, no TIA's, no seizures, no headaches, no temporary blindness one eye, no slurred speech, no peripheral neuropathy, has chronic pain in knees and back, mild instability of gait, no memory/cognitive dysfunction  Musculoskeletal: has arthritis, no joint swelling, no myalgias, some difficulty walking, normal mobility  Skin: no rash, no itching, no skin infections, no pressure sores or ulcerations  Psych: no anxiety, no depression, no nervousness, no unusual recent stress  Eyes: no blurry vision, no floaters, no recent  vision changes, wears glasses  ENT: has hearing loss, no loose or painful teeth, no dentures, last saw dentist recently  Hematologic: no easy bruising, no abnormal bleeding, no clotting disorder, no frequent epistaxis  Endocrine: has diabetes, does not check CBG's at home  Physical Exam:  BP (!) 160/78  Pulse 60  Resp 20  Ht 5\' 5"  (1.651 m)  Wt 190 lb (86.2 kg)  SpO2 96% Comment: RA  BMI 31.62 kg/m  General: Elderly but spry and well-appearing  HEENT: Unremarkable, NCAT, PERLA, EOMI, oropharynx clear, teeth in good conditon.  Neck: no JVD, no bruits, no adenopathy or thyromegaly  Chest: clear to auscultation, symmetrical breath sounds, no wheezes, no rhonchi  CV: RRR, grade III/VI crescendo/decrescendo murmur heard best at RSB, no diastolic murmur  Abdomen: soft, non-tender, no masses or organomegaly  Extremities: warm, well-perfused, pulses palpable in feet, no LE edema  Rectal/GU Deferred  Neuro: Grossly non-focal and symmetrical throughout  Skin: Clean and dry, no rashes, no breakdown  Diagnostic Tests:  [facility]  [address]  [place], [place] [address]  [phone]  -------------------------------------------------------------------  Transthoracic Echocardiography  Patient: Kristina Morrison, Kristina Morrison  MR #: [id number]  Study Date: 08/06/2017  Gender: F  Age: 70  Height:  Weight:  BSA:  Pt. Status:  Room:  Dora Sims, MD  REFERRING Tonny Bollman, MD  PERFORMING [facility], Outpatient  SONOGRAPHER [facility], [facility]CS  ATTENDING Chilton Si, MD  cc:  -------------------------------------------------------------------  LV EF: 60% - 65%  -------------------------------------------------------------------  Indications: Aortic Stenosis (I35.0).  -------------------------------------------------------------------  History: PMH: Dyspnea. Atrial fibrillation. Risk factors:  Family history of coronary artery disease. Former tobacco use.    Hypertension. Dyslipidemia.  -------------------------------------------------------------------  Study Conclusions  - Left ventricle: The cavity size was normal. Systolic function was  normal. The estimated ejection fraction was in the range of 60%  to 65%. Wall motion was normal; there were no regional wall  motion abnormalities. Doppler parameters are consistent with  abnormal left ventricular relaxation (grade 1 diastolic  dysfunction). Doppler parameters are consistent with high  ventricular filling pressure.  - Aortic valve: Valve mobility was restricted. There was moderate  stenosis. There was mild regurgitation. Valve area (VTI): 1.34  cm^2. Valve area (Vmax): 1.26 cm^2. Valve area (Vmean): 1.22  cm^2.  - Mitral valve: Calcified annulus. Transvalvular velocity was  within the normal range. There was no evidence for stenosis.  There was mild regurgitation.  - Left atrium: The atrium was mildly dilated.  - Right ventricle: The cavity size was normal. Wall thickness was  normal. Systolic function was normal.  - Tricuspid valve: There was trivial regurgitation.  - Pulmonary arteries: Systolic pressure was within the normal  range. PA peak pressure: 29 mm Hg (S).  -------------------------------------------------------------------  Study data: No prior study was available for comparison. Study  status: Routine. Procedure: Transthoracic echocardiography.  Image quality was adequate. Transthoracic  echocardiography. M-mode, complete 2D, 3D, spectral Doppler, and  color Doppler. Birthdate: Patient birthdate: 1924/11/13. Age:  Patient is 84 yr old. Sex: Gender: female. Blood pressure:  126/78 Patient status: Outpatient. Study date: Study date:  08/06/2017. Study time: 01:07 PM. Location: [facility]  -------------------------------------------------------------------  -------------------------------------------------------------------  Left ventricle: The cavity size was  normal. Systolic function was  normal. The estimated ejection fraction was in the range of 60% to  65%. Wall motion was normal; there were no regional wall motion  abnormalities. Doppler parameters are consistent with abnormal left  ventricular relaxation (grade 1 diastolic dysfunction). Doppler  parameters are consistent with high ventricular filling pressure.  -------------------------------------------------------------------  Aortic valve: Trileaflet; moderately thickened, moderately  calcified leaflets. Valve mobility was restricted. Doppler:  There was moderate stenosis. There was mild regurgitation. VTI  ratio of LVOT to aortic valve: 0.43. Valve area (VTI): 1.34 cm^2.  Peak velocity ratio of LVOT to aortic valve: 0.4. Valve area  (Vmax): 1.26 cm^2. Mean velocity ratio of LVOT to aortic valve:  0.39. Valve area (Vmean): 1.22 cm^2. Mean gradient (S): 30 mm  Hg. Peak gradient (S): 50 mm Hg.  -------------------------------------------------------------------  Aorta: Aortic root: The aortic root was normal in size.  -------------------------------------------------------------------  Mitral valve: Calcified annulus. Mobility was not restricted.  Doppler: Transvalvular velocity was within the normal range. There  was no evidence for stenosis. There was mild regurgitation. Peak  gradient (D): 4 mm Hg.  -------------------------------------------------------------------  Left atrium: The atrium was mildly dilated.  -------------------------------------------------------------------  Right ventricle: The cavity size was normal. Wall thickness was  normal. Systolic function was normal.  -------------------------------------------------------------------  Pulmonic valve: Structurally normal valve. Cusp separation was  normal. Doppler: Transvalvular velocity was within the normal  range. There was no evidence for stenosis. There was no  regurgitation.    -------------------------------------------------------------------  Tricuspid valve: Structurally normal valve. Doppler:  Transvalvular velocity was within the normal range. There was  trivial regurgitation.  -------------------------------------------------------------------  Pulmonary artery: The main pulmonary artery was normal-sized.  Systolic pressure was within the normal range.  -------------------------------------------------------------------  Right atrium: The atrium was normal in size.  -------------------------------------------------------------------  Pericardium: There was no pericardial effusion.  -------------------------------------------------------------------  Systemic veins:  Inferior vena cava: The vessel was normal in size. The  respirophasic diameter changes were in the normal range (= 50%),  consistent with normal central venous pressure.  -------------------------------------------------------------------  Measurements  Left ventricle Value Reference  Stroke volume, 2D 125 ml ----------  LV e&', lateral 8.81 cm/s ----------  LV E/e&', lateral 10.82 ----------  LV e&', medial 6.2 cm/s ----------  LV E/e&', medial 15.37 ----------  LV e&', average 7.51 cm/s ----------  LV E/e&', average 12.7 ----------  LVOT Value Reference  LVOT ID, S 20 mm ----------  LVOT area 3.14 cm^2 ----------  LVOT peak velocity, S 143 cm/s ----------  LVOT mean velocity, S 100 cm/s ----------  LVOT VTI, S 39.9 cm ----------  LVOT peak gradient, S 8 mm Hg ----------  Aortic valve Value Reference  Aortic valve peak velocity, S 355 cm/s ----------  Aortic valve mean velocity, S 257 cm/s ----------  Aortic valve VTI, S 93.8 cm ----------  Aortic mean gradient, S 30 mm Hg ----------  Aortic peak gradient, S 50 mm Hg ----------  VTI ratio, LVOT/AV 0.43 ----------  Aortic valve area, VTI 1.34 cm^2 ----------  Velocity ratio, peak, LVOT/AV 0.4 ----------  Aortic valve area, peak  velocity 1.26 cm^2 ----------  Velocity ratio, mean, LVOT/AV 0.39 ----------  Aortic valve area, mean velocity 1.22 cm^2 ----------  Aortic regurg pressure half-time 548 ms ----------  Left atrium Value Reference  LA volume, S 62 ml ----------  LA volume, ES, 1-p A4C 68 ml ----------  LA volume, ES, 1-p A2C 54.1 ml ----------  Mitral valve Value Reference  Mitral E-wave peak velocity 95.3 cm/s ----------  Mitral A-wave peak velocity 126 cm/s ----------  Mitral deceleration time (H) 394 ms 150 - 230  Mitral peak gradient, D 4 mm Hg ----------  Mitral E/A ratio, peak 0.8 ----------  Pulmonary arteries Value Reference  PA pressure, S, DP 29 mm Hg <=30  Tricuspid valve Value Reference  Tricuspid regurg peak velocity 255 cm/s ----------  Tricuspid peak RV-RA gradient 26 mm Hg ----------  Right atrium Value Reference  RA ID, S-I, ES, A4C (H) 59.1 mm 34 - 49  RA area, ES, A4C 19 cm^2 8.3 - 19.5  RA volume, ES, A/L 51.7 ml ----------  Systemic veins Value Reference  Estimated CVP 3 mm Hg ----------  Right ventricle Value Reference  TAPSE 28.3 mm ----------  RV pressure, S, DP 29 mm Hg <=30  RV s&', lateral, S 17.7 cm/s ----------  Legend:  (L) and (H) mark values outside specified reference range.  -------------------------------------------------------------------  Prepared and Electronically Authenticated by  Chilton Si, MD  2019-02-07T16:29:46  Physicians  Panel Physicians Referring Physician Case Authorizing Physician  Tonny Bollman, MD (Primary)    Procedures  RIGHT/LEFT HEART CATH AND CORONARY ANGIOGRAPHY  Conclusion  1. Mild nonobstructive CAD  2. Normal right heart pressures and normal PCWP  3. Moderate-severe aortic stenosis with mean transvalvular gradient 27 mmHg, calculated AVA 1.1 square cm (aortic valve is severely calcified and restricted on fluoroscopy and is very difficult to cross)  Pt has typical symptoms of progress aortic stenosis without CAD or other  clear causes of progressive dyspnea. Favor continued evaluation for TAVR.  Indications  Severe aortic stenosis [I35.0 (ICD-10-CM)]  Procedural Details/Technique  Technical Details INDICATION: Symptomatic aortic stenosis  PROCEDURAL DETAILS: There was an indwelling IV in a right antecubital vein. Using normal sterile technique, the IV was changed out for a 5 Fr brachial sheath over a 0.018 inch wire. Venography was performed. A glide wire was used to navigate the venous system and to guide the swan into the heart. The right wrist was then prepped, draped, and anesthetized with 1% lidocaine. Using the modified Seldinger technique a 5/6 French Slender sheath was placed in the right radial artery. Intra-arterial verapamil was administered through the radial artery sheath. IV heparin was administered after a JR4 catheter was advanced into the central aorta. The  right subclavian has severe tortuosity. It was difficult to pass a catheter into the aorta. The JR4 catheter was directed into the ascending aorta with a 0.035 inch glide wire. A Swan-Ganz catheter was used for the right heart catheterization. Standard protocol was followed for recording of right heart pressures and sampling of oxygen saturations. Fick cardiac output was calculated. Standard Judkins catheters were used for selective coronary angiography. A guide catheter was used for RCA angiography. The aortic valve is crossed with an AL-1 directing a J-wire. The valve was very difficult to cross. There were no immediate procedural complications. The patient was transferred to the post catheterization recovery area for further monitoring.     Estimated blood loss <50 mL.  During this procedure the patient was administered the following to achieve and maintain moderate conscious sedation: Versed 1 mg, Fentanyl 25 mcg, while the patient's heart rate, blood pressure, and oxygen saturation were continuously monitored. The period of conscious sedation was  61 minutes, of which I was present face-to-face 100% of this time.  Coronary Findings  Diagnostic  Dominance: Right  Left Main  The vessel exhibits minimal luminal irregularities.  Left Anterior Descending  The vessel exhibits minimal luminal irregularities. The vessel is mildly calcified. Mildly calcified, no obstructive disease.  Left Circumflex  The vessel exhibits minimal luminal irregularities. Large vessel without significant stenosis, large first OM branch  First Obtuse Marginal Branch  The vessel exhibits minimal luminal irregularities.  Right Coronary Artery  Large, dominant vessel. Calcified with mild nonobstructive disease.  Mid RCA lesion 30% stenosed  Mid RCA lesion is 30% stenosed. The lesion is severely calcified.  Intervention  No interventions have been documented.  Left Heart  Aortic Valve There is severe aortic valve stenosis. The aortic valve is calcified. There is restricted aortic valve motion.  Coronary Diagrams  Diagnostic Diagram     Implants     No implant documentation for this case.  MERGE Images  Link to Procedure Log   Show images for CARDIAC CATHETERIZATION Procedure Log  Hemo Data   Most Recent Value  Fick Cardiac Output 6.63 L/min  Fick Cardiac Output Index 3.42 (L/min)/BSA  Aortic Mean Gradient 27.3 mmHg  Aortic Peak Gradient 27 mmHg  Aortic Valve Area 1.08  Aortic Value Area Index 0.56 cm2/BSA  RA A Wave 14 mmHg  RA V Wave 9 mmHg  RA Mean 7 mmHg  RV Systolic Pressure 31 mmHg  RV Diastolic Pressure 3 mmHg  RV EDP 7 mmHg  PA Systolic Pressure 27 mmHg  PA Diastolic Pressure 11 mmHg  PA Mean 17 mmHg  PW A Wave 14 mmHg  PW V Wave 13 mmHg  PW Mean 11 mmHg  AO Systolic Pressure 141 mmHg  AO Diastolic Pressure 53 mmHg  AO Mean 85 mmHg  LV Systolic Pressure 161 mmHg  LV Diastolic Pressure 5 mmHg  LV EDP 18 mmHg  Arterial Occlusion Pressure Extended Systolic Pressure 142 mmHg  Arterial Occlusion Pressure Extended Diastolic Pressure 55  mmHg  Arterial Occlusion Pressure Extended Mean Pressure 84 mmHg  Left Ventricular Apex Extended Systolic Pressure 169 mmHg  Left Ventricular Apex Extended Diastolic Pressure 8 mmHg  Left Ventricular Apex Extended EDP Pressure 16 mmHg  QP/QS 1  TPVR Index 4.97 HRUI  TSVR Index 24.85 HRUI  PVR SVR Ratio 0.08  TPVR/TSVR Ratio 0.2  STS Adult Cardiac Surgery Database Version 2.9 RISK SCORES Procedure: Isolated AVR CALCULATE  Risk of Mortality:  3.080%   Renal Failure:  2.926%  Permanent Stroke:  2.682%   Prolonged Ventilation:  8.190%   DSW Infection:  0.061%   Reoperation:  3.134%   Morbidity or Mortality:  13.650%   Short Length of Stay:  17.785%   Long Length of Stay:  9.866%   ADDENDUM REPORT: 08/14/2017 16:15  ADDENDUM: Please see separate dictation for contemporaneously obtained CTA chest, abdomen and pelvis dated 08/14/2016 for full description of relevant extracardiac findings.   Electronically Signed   By: Trudie Reed M.D.   On: 08/14/2017 16:15   Addended by Florencia Reasons, MD on 08/14/2017 4:17 PM    Study Result   CLINICAL DATA:  Aortic Stenosis  EXAM: Cardiac TAVR CT  TECHNIQUE: The patient was scanned on a Siemens Force 192 slice scanner. A 120 kV retrospective scan was triggered in the ascending thoracic aorta at 140 HU's. Gantry rotation speed was 250 msecs and collimation was .6 mm. No beta blockade or nitro were given. The 3D data set was reconstructed in 5% intervals of the R-R cycle. Systolic and diastolic phases were analyzed on a dedicated work station using MPR, MIP and VRT modes. The patient received 80 cc of contrast.  FINDINGS: Aortic Valve: Tri leaflet and calcified with restricted leaflet motion Nodular calcium in the annulus at the base of the non coronary cusp  Aorta: Moderate calcific aortic atherosclerosis with mild fusiform dilatation of the ascending root and arch  Sino-tubular Junction: 29.5 mm  Ascending  Thoracic Aorta: 40 mm  Aortic Arch: 36 mm  Descending Thoracic Aorta: 28 mm  Sinus of Valsalva Measurements:  Non-coronary: 27 mm  Right - coronary: 25 mm  Left -   coronary: 27 mm  Coronary Artery Height above Annulus:  Left Main: 12 mm above annulus  Right Coronary: 10.8 mm above annulus  Virtual Basal Annulus Measurements:  Maximum / Minimum Diameter: 18.5 mm x 24.2 mm  Perimeter: 68 mm  Area: 360 mm2  Coronary Arteries: Somewhat shallow but likely sufficient height above annulus for deployment  Optimum Fluoroscopic Angle for Delivery: LAO 19 degrees  IMPRESSION: 1. Calcified tri leaflet AV with annulus 360 mm2 suitable for a 23 mm Sapien 3 valve Nodular calcification of annulus at base of non coronary cusp increases risk of peri valvular regurgitation  2.  Optimum angiographic angle for deployment LAO 16 degrees  3.  No LAA thrombus  4. Somewhat shallow coronary arteries but likely sufficient height above annulus for deployment  5.  Fusiform dilatation of ascending aortic root 4.0 cm  Charlton Haws  Electronically Signed: By: Charlton Haws M.D. On: 08/14/2017 16:08      CLINICAL DATA:  84 year old female with history of severe aortic stenosis. Preprocedural study prior to potential TAVR procedure.  EXAM: CT ANGIOGRAPHY CHEST, ABDOMEN AND PELVIS  TECHNIQUE: Multidetector CT imaging through the chest, abdomen and pelvis was performed using the standard protocol during bolus administration of intravenous contrast. Multiplanar reconstructed images and MIPs were obtained and reviewed to evaluate the vascular anatomy.  CONTRAST:  ISOVUE-370 IOPAMIDOL (ISOVUE-370) INJECTION 76%  COMPARISON:  None.  FINDINGS: CTA CHEST FINDINGS  Cardiovascular: Heart size is borderline enlarged. There is no significant pericardial fluid, thickening or pericardial calcification. There is aortic atherosclerosis, as well  as atherosclerosis of the great vessels of the mediastinum and the coronary arteries, including calcified atherosclerotic plaque in the left circumflex and right coronary arteries. Severe thickening calcification of the aortic valve. Severe calcifications of the mitral annulus. Ectasia of the ascending thoracic aorta which measures  up to 4.1 cm in diameter.  Mediastinum/Lymph Nodes: No pathologically enlarged mediastinal or hilar lymph nodes. Small hiatal hernia. No axillary lymphadenopathy.  Lungs/Pleura: No suspicious appearing pulmonary nodules or masses. No acute consolidative airspace disease. No pleural effusions.  Musculoskeletal/Soft Tissues: There are no aggressive appearing lytic or blastic lesions noted in the visualized portions of the skeleton.  CTA ABDOMEN AND PELVIS FINDINGS  Hepatobiliary: No suspicious appearing cystic or solid hepatic lesions. No intra or extrahepatic biliary ductal dilatation. Status post cholecystectomy.  Pancreas: No pancreatic mass. No pancreatic ductal dilatation. No pancreatic or peripancreatic fluid or inflammatory changes.  Spleen: Unremarkable.  Adrenals/Urinary Tract: 1.9 cm low-attenuation lesion in the lower pole left kidney is compatible with a simple cyst. Right kidney and bilateral adrenal glands are normal in appearance. No hydroureteronephrosis. Urinary bladder is normal in appearance.  Stomach/Bowel: The appearance of the stomach is normal. No pathologic dilatation of small bowel or colon. Numerous colonic diverticulae are noted, particularly in the sigmoid colon, without surrounding inflammatory changes to suggest an acute diverticulitis at this time. Normal appendix.  Vascular/Lymphatic: Aortic atherosclerosis, without evidence of aneurysm or dissection in the abdominal or pelvic vasculature. Celiac axis, superior mesenteric artery and inferior mesenteric artery are all widely patent without hemodynamically  significant stenosis. Single renal arteries bilaterally are widely patent without hemodynamically significant stenosis. No lymphadenopathy noted in the abdomen or pelvis.  Reproductive: Status post hysterectomy. Ovaries are not confidently identified may be surgically absent or atrophic.  Other: No significant volume of ascites.  No pneumoperitoneum.  Musculoskeletal: There are no aggressive appearing lytic or blastic lesions noted in the visualized portions of the skeleton.  VASCULAR MEASUREMENTS PERTINENT TO TAVR:  AORTA:  Minimal Aortic Diameter-15 x 15 mm  Severity of Aortic Calcification-severe  RIGHT PELVIS:  Right Common Iliac Artery -  Minimal Diameter-12 x 10.4 mm  Tortuosity-mild  Calcification-mild  Right External Iliac Artery -  Minimal Diameter-9 x 7.5 mm  Tortuosity-moderate to severe  Calcification - none  Right Common Femoral Artery -  Minimal Diameter-8.7 x 7.7 mm  Tortuosity-mild  Calcification - none  LEFT PELVIS:  Left Common Iliac Artery -  Minimal Diameter-10.6 x 9.9 mm  Tortuosity-mild  Calcification-mild  Left External Iliac Artery -  Minimal Diameter-8.9 x 7.8 mm  Tortuosity-severe  Calcification - none  Left Common Femoral Artery -  Minimal Diameter-8.3 x 8.6 mm  Tortuosity-mild  Calcification-none  Review of the MIP images confirms the above findings.  IMPRESSION: 1. Vascular findings and measurements pertinent to potential TAVR procedure, as detailed above. 2. Severe thickening calcification of the aortic valve, compatible with the reported clinical history of severe aortic stenosis. 3. Ectasia of the ascending thoracic aorta (4.1 cm in diameter). Recommend annual imaging followup by CTA or MRA. This recommendation follows 2010 ACCF/AHA/AATS/ACR/ASA/SCA/SCAI/SIR/STS/SVM Guidelines for the Diagnosis and Management of Patients with Thoracic Aortic Disease. Circulation.  2010; 121: Z610-R604. 4. Colonic diverticulosis without evidence of acute diverticulitis at this time. 5. Small hiatal hernia. 6. Additional incidental findings, as above.  Aortic Atherosclerosis (ICD10-I70.0).   Electronically Signed   By: Trudie Reed M.D.   On: 08/14/2017 16:57   Impression:   This 84 year old woman has stage D, moderate-severe, symptomatic aortic stenosis with New York Heart Association class II symptoms of progressive exertional shortness of breath and fatigue consistent with chronic diastolic congestive heart failure. In addition she has had some lightheadedness, chest tightness and cold sweat sensation with activity. I have personally reviewed her recent echocardiogram, cardiac catheterization, and  CTA studies. Her echocardiogram shows a trileaflet aortic valve with moderately calcified and thickened leaflets with restricted mobility and a mean transvalvular gradient of 30 mmHg. She has normal left ventricular systolic function. Her mean transvalvular gradient at catheterization was 27 mmHg with a calculated aortic valve area of 1.1 cm. There is mild nonobstructive coronary disease. Although her mean transvalvular gradient is still in the moderate range her valve certainly looks severely stenotic and her symptoms are consistent with progressive aortic stenosis.  I think transcatheter aortic valve replacement would be a reasonable alternative for this patient. Her gated cardiac CTA shows anatomy favorable for TAVR. Her abdominal and pelvic CTA shows anatomy suitable for transfemoral insertion.  The patient and her daughter were counseled at length regarding treatment alternatives for management of severe symptomatic aortic stenosis. The risks and benefits of surgical intervention has been discussed in detail. Long-term prognosis with medical therapy was discussed. Alternative approaches such as conventional surgical aortic valve replacement, transcatheter aortic  valve replacement, and palliative medical therapy were compared and contrasted at length. This discussion was placed in the context of the patient's own specific clinical presentation and past medical history. All of their questions have been addressed.   The patient has been advised of a variety of complications that might develop with transcatheter aortic valve replacement including but not limited to risks of death, stroke, paravalvular leak, aortic dissection or other major vascular complications, aortic annulus rupture, device embolization, cardiac rupture or perforation, mitral regurgitation, acute myocardial infarction, arrhythmia, heart block or bradycardia requiring permanent pacemaker placement, congestive heart failure, respiratory failure, renal failure, pneumonia, infection, other late complications related to structural valve deterioration or migration, or other complications that might ultimately cause a temporary or permanent loss of functional independence or other long term morbidity. The patient provides full informed consent for the procedure as described if we decide that she is a candidate for transcatheter aortic valve replacement. All of their questions were answered.   Plan:   Transfemoral TAVR  Alleen Borne, MD

## 2017-08-25 NOTE — Progress Notes (Signed)
Patient ID: Kristina FlurryBetty H Morrison, female   DOB: 01/08/1925, 84 y.o.   MRN: [id number] TCTS Evening Rounds:  Hemodynamically stable in sinus rhythm  Awake and alert Groin sites look good.

## 2017-08-25 NOTE — Progress Notes (Signed)
Dr. Hart RochesterHollis made aware of wedding band.  Short Stay RN was not able to get ring off of.  Gave instructions to patient that if OR staff needed to the ring may have to be cut off if it interferes with surgery

## 2017-08-25 NOTE — Interval H&P Note (Signed)
History and Physical Interval Note:  08/25/2017 10:01 AM  Kristina FlurryBetty H Lortz  has presented today for surgery, with the diagnosis of severe aortic stenosis  The various methods of treatment have been discussed with the patient and family. After consideration of risks, benefits and other options for treatment, the patient has consented to  Procedure(s): TRANSCATHETER AORTIC VALVE REPLACEMENT, TRANSFEMORAL (N/A) TRANSESOPHAGEAL ECHOCARDIOGRAM (TEE) (N/A) as a surgical intervention .  The patient's history has been reviewed, patient examined, no change in status, stable for surgery.  I have reviewed the patient's chart and labs.  Questions were answered to the patient's satisfaction.     Alleen BorneBryan K Bartle

## 2017-08-25 NOTE — Interval H&P Note (Signed)
History and Physical Interval Note:  08/25/2017 9:01 AM  Kristina Morrison  has presented today for surgery, with the diagnosis of severe aortic stenosis  The various methods of treatment have been discussed with the patient and family. After consideration of risks, benefits and other options for treatment, the patient has consented to  Procedure(s): TRANSCATHETER AORTIC VALVE REPLACEMENT, TRANSFEMORAL (N/A) TRANSESOPHAGEAL ECHOCARDIOGRAM (TEE) (N/A) as a surgical intervention .  The patient's history has been reviewed, patient examined, no change in status, stable for surgery.  I have reviewed the patient's chart and labs.  Questions were answered to the patient's satisfaction.    Pt has completed TAVR workup with cath, CTA studies, and cardiac surgical evaluation. Plan for percutaneous transfemoral TAVR reviewed again with the patient and her daughter. All questions answered. No interval change in symptoms.   Tonny BollmanMichael Cornelius Schuitema MD

## 2017-08-26 ENCOUNTER — Inpatient Hospital Stay (HOSPITAL_COMMUNITY): Payer: Medicare Other

## 2017-08-26 ENCOUNTER — Encounter (HOSPITAL_COMMUNITY): Payer: Self-pay | Admitting: Cardiovascular Disease

## 2017-08-26 DIAGNOSIS — I350 Nonrheumatic aortic (valve) stenosis: Principal | ICD-10-CM

## 2017-08-26 DIAGNOSIS — I361 Nonrheumatic tricuspid (valve) insufficiency: Secondary | ICD-10-CM

## 2017-08-26 LAB — CBC
HCT: 29.2 % — ABNORMAL LOW (ref 36.0–46.0)
Hemoglobin: 9.3 g/dL — ABNORMAL LOW (ref 12.0–15.0)
MCH: 30 pg (ref 26.0–34.0)
MCHC: 31.8 g/dL (ref 30.0–36.0)
MCV: 94.2 fL (ref 78.0–100.0)
PLATELETS: 133 10*3/uL — AB (ref 150–400)
RBC: 3.1 MIL/uL — AB (ref 3.87–5.11)
RDW: 14.8 % (ref 11.5–15.5)
WBC: 8.5 10*3/uL (ref 4.0–10.5)

## 2017-08-26 LAB — GLUCOSE, CAPILLARY
GLUCOSE-CAPILLARY: 171 mg/dL — AB (ref 65–99)
GLUCOSE-CAPILLARY: 177 mg/dL — AB (ref 65–99)
GLUCOSE-CAPILLARY: 153 mg/dL — AB (ref 65–99)
Glucose-Capillary: 246 mg/dL — ABNORMAL HIGH (ref 65–99)

## 2017-08-26 LAB — BASIC METABOLIC PANEL
ANION GAP: 11 (ref 5–15)
BUN: 11 mg/dL (ref 6–20)
CALCIUM: 8.5 mg/dL — AB (ref 8.9–10.3)
CHLORIDE: 105 mmol/L (ref 101–111)
CO2: 24 mmol/L (ref 22–32)
Creatinine, Ser: 0.89 mg/dL (ref 0.44–1.00)
GFR calc non Af Amer: 55 mL/min — ABNORMAL LOW (ref >60)
GLUCOSE: 124 mg/dL — AB (ref 65–99)
POTASSIUM: 3.9 mmol/L (ref 3.5–5.1)
Sodium: 140 mmol/L (ref 135–145)

## 2017-08-26 LAB — ECHOCARDIOGRAM COMPLETE
HEIGHTINCHES: 65 in
WEIGHTICAEL: 3153.46 [oz_av]

## 2017-08-26 LAB — MAGNESIUM: Magnesium: 1.5 mg/dL — ABNORMAL LOW (ref 1.7–2.4)

## 2017-08-26 MED ORDER — ACETAMINOPHEN 325 MG PO TABS
650.0000 mg | ORAL_TABLET | Freq: Four times a day (QID) | ORAL | Status: DC | PRN
  Administered 2017-08-26: 650 mg via ORAL
  Filled 2017-08-26: qty 2

## 2017-08-26 MED ORDER — CHLORHEXIDINE GLUCONATE CLOTH 2 % EX PADS
6.0000 | MEDICATED_PAD | Freq: Every day | CUTANEOUS | Status: DC
  Administered 2017-08-26: 6 via TOPICAL

## 2017-08-26 MED ORDER — APIXABAN 5 MG PO TABS
5.0000 mg | ORAL_TABLET | Freq: Two times a day (BID) | ORAL | Status: DC
  Administered 2017-08-26 – 2017-08-27 (×3): 5 mg via ORAL
  Filled 2017-08-26 (×3): qty 1

## 2017-08-26 MED ORDER — SODIUM CHLORIDE 0.9% FLUSH
10.0000 mL | Freq: Two times a day (BID) | INTRAVENOUS | Status: DC
  Administered 2017-08-26 (×2): 10 mL

## 2017-08-26 MED ORDER — LISINOPRIL 10 MG PO TABS
20.0000 mg | ORAL_TABLET | Freq: Every day | ORAL | Status: DC
  Administered 2017-08-27: 20 mg via ORAL
  Filled 2017-08-26: qty 2
  Filled 2017-08-26: qty 1

## 2017-08-26 MED ORDER — GLIMEPIRIDE 1 MG PO TABS
1.0000 mg | ORAL_TABLET | Freq: Every day | ORAL | Status: DC
  Administered 2017-08-27: 1 mg via ORAL
  Filled 2017-08-26: qty 1

## 2017-08-26 MED ORDER — SODIUM CHLORIDE 0.9% FLUSH: 10.0000 mL | INTRAVENOUS | Status: DC | PRN

## 2017-08-26 MED FILL — Sodium Chloride IV Soln 0.9%: INTRAVENOUS | Qty: 250 | Status: AC

## 2017-08-26 MED FILL — Magnesium Sulfate Inj 50%: INTRAMUSCULAR | Qty: 10 | Status: AC

## 2017-08-26 MED FILL — Potassium Chloride Inj 2 mEq/ML: INTRAVENOUS | Qty: 40 | Status: AC

## 2017-08-26 MED FILL — Phenylephrine HCl Inj 10 MG/ML: INTRAMUSCULAR | Qty: 2 | Status: AC

## 2017-08-26 MED FILL — Heparin Sodium (Porcine) Inj 1000 Unit/ML: INTRAMUSCULAR | Qty: 30 | Status: AC

## 2017-08-26 NOTE — Plan of Care (Signed)
  Progressing Education: Knowledge of General Education information will improve 08/26/2017 2229 - Progressing by Ruel FavorsBrown-Staples, Starletta Houchin, RN Health Behavior/Discharge Planning: Ability to manage health-related needs will improve 08/26/2017 2229 - Progressing by Ruel FavorsBrown-Staples, Kimala Horne, RN Clinical Measurements: Ability to maintain clinical measurements within normal limits will improve 08/26/2017 2229 - Progressing by Ruel FavorsBrown-Staples, Awa Bachicha, RN Will remain free from infection 08/26/2017 2229 - Progressing by Ruel FavorsBrown-Staples, Epifania Littrell, RN Diagnostic test results will improve 08/26/2017 2229 - Progressing by Ruel FavorsBrown-Staples, Avryl Roehm, RN Respiratory complications will improve 08/26/2017 2229 - Progressing by Ruel FavorsBrown-Staples, Geoff Dacanay, RN Cardiovascular complication will be avoided 08/26/2017 2229 - Progressing by Ruel FavorsBrown-Staples, Johnmatthew Solorio, RN Activity: Risk for activity intolerance will decrease 08/26/2017 2229 - Progressing by Ruel FavorsBrown-Staples, Italia Wolfert, RN Nutrition: Adequate nutrition will be maintained 08/26/2017 2229 - Progressing by Ruel FavorsBrown-Staples, Patton Rabinovich, RN Coping: Level of anxiety will decrease 08/26/2017 2229 - Progressing by Ruel FavorsBrown-Staples, Zebulun Deman, RN Elimination: Will not experience complications related to bowel motility 08/26/2017 2229 - Progressing by Ruel FavorsBrown-Staples, Misael Mcgaha, RN Will not experience complications related to urinary retention 08/26/2017 2229 - Progressing by Ruel FavorsBrown-Staples, Labrea Eccleston, RN Pain Managment: General experience of comfort will improve 08/26/2017 2229 - Progressing by Ruel FavorsBrown-Staples, Blaine Hari, RN Safety: Ability to remain free from injury will improve 08/26/2017 2229 - Progressing by Ruel FavorsBrown-Staples, Fleda Pagel, RN Skin Integrity: Risk for impaired skin integrity will decrease 08/26/2017 2229 - Progressing by Ruel FavorsBrown-Staples, Camika Marsico, RN Education: Ability to demonstrate proper wound care will improve 08/26/2017 2229 - Progressing by Ruel FavorsBrown-Staples, Zeidy Tayag, RN Knowledge of disease  or condition will improve 08/26/2017 2229 - Progressing by Ruel FavorsBrown-Staples, Kendy Haston, RN Knowledge of the prescribed therapeutic regimen will improve 08/26/2017 2229 - Progressing by Ruel FavorsBrown-Staples, Reginia Battie, RN Activity: Risk for activity intolerance will decrease 08/26/2017 2229 - Progressing by Ruel FavorsBrown-Staples, Isais Klipfel, RN Cardiac: Hemodynamic stability will improve 08/26/2017 2229 - Progressing by Ruel FavorsBrown-Staples, Spring San, RN Clinical Measurements: Postoperative complications will be avoided or minimized 08/26/2017 2229 - Progressing by Ruel FavorsBrown-Staples, Shreyansh Tiffany, RN Respiratory: Respiratory status will improve 08/26/2017 2229 - Progressing by Ruel FavorsBrown-Staples, Tosca Pletz, RN Skin Integrity: Wound healing without signs and symptoms of infection 08/26/2017 2229 - Progressing by Ruel FavorsBrown-Staples, Ronaldo Crilly, RN Risk for impaired skin integrity will decrease 08/26/2017 2229 - Progressing by Ruel FavorsBrown-Staples, Marny Smethers, RN Urinary Elimination: Ability to achieve and maintain adequate renal perfusion and functioning will improve 08/26/2017 2229 - Progressing by Ruel FavorsBrown-Staples, Rosemarie Galvis, RN

## 2017-08-26 NOTE — Progress Notes (Addendum)
Pt on bedrest and walked earlier around unit. Discussed walking at home, groin restrictions, and CRPII. Pt voiced understanding but not interested in CRPII at this time. Daughter present and receptive as well. Will return tomorrow to possibly ambulate with cane since that is what she normally uses. [id number] Ethelda ChickKristan Jamichael Knotts CES, ACSM 2:47 PM 08/26/2017

## 2017-08-26 NOTE — Progress Notes (Signed)
Progress Note  Patient Name: Kristina Morrison Date of Encounter: 08/26/2017  Primary Cardiologist: No primary care provider on file.   Subjective   The patient is complaining of a headache this morning.  She otherwise is doing well and has no specific complaints.  She denies chest pain or shortness of breath.  Inpatient Medications    Scheduled Meds: . atorvastatin  20 mg Oral Daily  . carvedilol  25 mg Oral Daily  . chlorhexidine  15 mL Mouth/Throat NOW  . cholecalciferol  1,000 Units Oral Daily  . dronedarone  400 mg Oral BID WC  . folic acid  500 mcg Oral Daily  . insulin aspart  0-15 Units Subcutaneous TID WC  . levothyroxine  50 mcg Oral QAC breakfast  . multivitamin with minerals  1 tablet Oral Daily  . omega-3 acid ethyl esters  1,000 mg Oral BID  . [START ON 08/27/2017] pantoprazole  40 mg Oral Daily  . predniSONE  5 mg Oral Daily  . sodium chloride flush  3 mL Intravenous Q12H  . vitamin C  500 mg Oral BID   Continuous Infusions: . sodium chloride    . albumin human    . lactated ringers    . nitroGLYCERIN Stopped (08/25/17 1343)  . phenylephrine (NEO-SYNEPHRINE) Adult infusion Stopped (08/25/17 1343)   PRN Meds: sodium chloride, albumin human, lactated ringers, metoprolol tartrate, midazolam, morphine injection, ondansetron (ZOFRAN) IV, oxyCODONE, sodium chloride flush, traMADol   Vital Signs    Vitals:   08/26/17 0400 08/26/17 0500 08/26/17 0600 08/26/17 0700  BP: (!) 148/62  (!) 147/59   Pulse: 65 62 (!) 54 62  Resp: 15 14 12 18   Temp:      TempSrc:      SpO2: 96% 97% 96% 98%  Weight:  197 lb 1.5 oz (89.4 kg)    Height:  5\' 5"  (1.651 m)      Intake/Output Summary (Last 24 hours) at 08/26/2017 0732 Last data filed at 08/26/2017 0500 Gross per 24 hour  Intake 1824.61 ml  Output 375 ml  Net 1449.61 ml   Filed Weights   08/25/17 0851 08/26/17 0500  Weight: 199 lb (90.3 kg) 197 lb 1.5 oz (89.4 kg)    Telemetry    Sinus rhythm with PVCs-  Personally Reviewed  ECG    Normal sinus rhythm with left IVCD, QRS 124 ms - Personally Reviewed  Physical Exam  Pleasant elderly woman in NAD GEN: No acute distress.   Neck: No JVD Cardiac: RRR, 2/6 SEM at the RUSB, no diastolic murmur Respiratory: Clear to auscultation bilaterally. GI: Soft, nontender, non-distended  MS: No edema; No deformity. Bilateral groin sites clear Neuro:  Nonfocal  Psych: Normal affect   Labs    Chemistry Recent Labs  Lab 08/21/17 1334 08/25/17 0830 08/25/17 1334 08/26/17 0310  NA 139 142 141 140  K 4.7 4.8 4.6 3.9  CL 109 109  --  105  CO2 20* 20*  --  24  GLUCOSE 210* 131* 259* 124*  BUN 11 16  --  11  CREATININE 0.85 1.11*  --  0.89  CALCIUM 8.9 9.1  --  8.5*  PROT 6.5  --   --   --   ALBUMIN 3.6  --   --   --   AST 21  --   --   --   ALT 18  --   --   --   ALKPHOS 49  --   --   --  BILITOT 0.7  --   --   --   GFRNONAA 59* 42*  --  55*  GFRAA >60 49*  --  >60  ANIONGAP 10 13  --  11     Hematology Recent Labs  Lab 08/21/17 1334 08/25/17 1334 08/26/17 0310  WBC 8.4  --  8.5  RBC 3.52*  --  3.10*  HGB 10.3* 9.2* 9.3*  HCT 33.2* 27.0* 29.2*  MCV 94.3  --  94.2  MCH 29.3  --  30.0  MCHC 31.0  --  31.8  RDW 15.1  --  14.8  PLT 157  --  133*    Cardiac EnzymesNo results for input(s): TROPONINI in the last 168 hours. No results for input(s): TROPIPOC in the last 168 hours.   BNP Recent Labs  Lab 08/25/17 0840  BNP 187.2*     DDimer No results for input(s): DDIMER in the last 168 hours.   Radiology    Dg Chest Port 1 View  Result Date: 08/25/2017 CLINICAL DATA:  Status post aortic valve replacement EXAM: PORTABLE CHEST 1 VIEW COMPARISON:  August 21, 2017 FINDINGS: Central catheter tip is in the superior vena cava. No pneumothorax. There is atelectatic change in both upper lobes, slightly more on the right than on the left. There is no consolidation. Heart is mildly enlarged with pulmonary vascularity within normal  limits. Transcatheter placed aortic valve replacement noted. There is aortic atherosclerosis. No adenopathy. There is a sclerotic area in the proximal right humerus, unchanged. IMPRESSION: Transcatheter placed aortic valve replacement noted. Areas of upper lobe atelectatic change. No edema or consolidation. Heart mildly enlarged. There is aortic atherosclerosis. Sclerotic area in proximal right humerus consistent with either infarct or enchondroma. Aortic Atherosclerosis (ICD10-I70.0). Electronically Signed   By: Bretta BangWilliam  Woodruff III M.D.   On: 08/25/2017 14:07    Cardiac Studies   POD #1 echo pending  Patient Profile     84 y.o. female with severe symptomatic aortic stenosis underwent elective TAVR 08/25/2016 with a Medtronic Evolut Pro 26 mm THV  Assessment & Plan    1.  Severe symptomatic aortic stenosis: The patient seems to be progressing very well postoperative day #1.  We will transfer her to a telemetry bed today and anticipate discharge tomorrow.  Her postoperative day #1 echo will be completed today.  2.  Paroxysmal atrial fibrillation: The patient is treated with a rhythm control strategy using Multaq.  She is anticoagulated with apixaban and this will be restarted this morning.  She appears to be maintaining sinus rhythm.  3.  Anemia, postoperative blood loss, expected: Bilateral groin sites are clear.  There is no flank tenderness.  No suspicion for active bleeding. Will repeat a CBC tomorrow am.   For questions or updates, please contact [facility] Please consult [email or web address] for contact info under Cardiology/STEMI.   Signed, Tonny BollmanMichael Ilian Wessell, MD  08/26/2017, 7:32 AM

## 2017-08-26 NOTE — Progress Notes (Signed)
  Echocardiogram 2D Echocardiogram has been performed.  Kristina Morrison 08/26/2017, 12:01 PM

## 2017-08-27 ENCOUNTER — Other Ambulatory Visit: Payer: Self-pay

## 2017-08-27 DIAGNOSIS — Z952 Presence of prosthetic heart valve: Secondary | ICD-10-CM

## 2017-08-27 DIAGNOSIS — I350 Nonrheumatic aortic (valve) stenosis: Secondary | ICD-10-CM

## 2017-08-27 DIAGNOSIS — I480 Paroxysmal atrial fibrillation: Secondary | ICD-10-CM

## 2017-08-27 LAB — CBC
HEMATOCRIT: 28.2 % — AB (ref 36.0–46.0)
HEMOGLOBIN: 9 g/dL — AB (ref 12.0–15.0)
MCH: 29.7 pg (ref 26.0–34.0)
MCHC: 31.9 g/dL (ref 30.0–36.0)
MCV: 93.1 fL (ref 78.0–100.0)
Platelets: 123 10*3/uL — ABNORMAL LOW (ref 150–400)
RBC: 3.03 MIL/uL — ABNORMAL LOW (ref 3.87–5.11)
RDW: 14.6 % (ref 11.5–15.5)
WBC: 8.7 10*3/uL (ref 4.0–10.5)

## 2017-08-27 LAB — GLUCOSE, CAPILLARY
GLUCOSE-CAPILLARY: 134 mg/dL — AB (ref 65–99)
Glucose-Capillary: 184 mg/dL — ABNORMAL HIGH (ref 65–99)

## 2017-08-27 MED ORDER — ASPIRIN EC 81 MG PO TBEC: 81.0000 mg | DELAYED_RELEASE_TABLET | Freq: Every day | ORAL | 0 refills | Status: DC

## 2017-08-27 NOTE — Discharge Summary (Signed)
Discharge Summary    Patient ID: Kristina Morrison,  MRN: [id number], DOB/AGE: 1925-01-11 84 y.o.  Admit date: 08/25/2017 Discharge date: 08/27/2017  Primary Care Provider: [facility] Primary Cardiologist: Dr. Donnie Aho   Discharge Diagnoses    Principal Problem:   Severe aortic stenosis Active Problems:   PAF (paroxysmal atrial fibrillation) (HCC)   Allergies Allergies  Allergen Reactions  . Cinobac [Cinoxacin] Swelling    Tongue swells.  . Penicillins Hives and Itching    PATIENT HAS HAD A PCN REACTION WITH IMMEDIATE RASH, FACIAL/TONGUE/THROAT SWELLING, SOB, OR LIGHTHEADEDNESS WITH HYPOTENSION:  #  #  #  YES  #  #  #   Has patient had a PCN reaction causing severe rash involving mucus membranes or skin necrosis: No Has patient had a PCN reaction that required hospitalization: No Has patient had a PCN reaction occurring within the last 10 years: #  #  #  YES  #  #  #  If all of the above answers are "NO", then may proceed with Cephalosporin use.   . Sulfa Antibiotics Other (See Comments)    UNSPECIFIED REACTION     Diagnostic Studies/Procedures    TAVR: 08/25/17  Procedure:        Transcatheter Aortic Valve Replacement - Percutaneous  Transfemoral Approach             Medtronic Evolut Pro THV (size 26 mm, serial # [id number])              Co-Surgeons:                        Alleen Borne, MD and Tonny Bollman, MD  Anesthesiologist:                  Shona Simpson, MD  Echocardiographer:              Charlton Haws, MD  Pre-operative Echo Findings: ? Severe aortic stenosis ? Normal left ventricular systolic function  Post-operative Echo Findings: ? No paravalvular leak ? Unchanged left ventricular systolic function  TTE: 08/26/17  Study Conclusions  - Left ventricle: The cavity size was normal. There was mild   concentric hypertrophy. Systolic function was normal. The   estimated ejection fraction was in the range of 60% to 65%. Wall   motion was normal; there were no regional wall motion   abnormalities. Doppler parameters are consistent with abnormal   left ventricular relaxation (grade 1 diastolic dysfunction).   Doppler parameters are consistent with high ventricular filling   pressure. - Aortic valve: A 26 mm Corevalve Evolut Pro TAVR bioprosthesis was   present. Transvalvular velocity was within the normal range.   There was no stenosis. There was moderate perivalvular   regurgitation. Peak velocity (S): 188 cm/s. Mean gradient (S): 8   mm Hg. Valve area (VTI): 1.53 cm^2. Valve area (Vmax): 1.56 cm^2.   Valve area (Vmean): 1.63 cm^2. - Mitral valve: Calcified annulus. Transvalvular velocity was   within the normal range. There was no evidence for stenosis.   There was mild regurgitation. Valve area by pressure half-time:   1.68 cm^2. - Left atrium: The atrium was mildly to moderately dilated. - Right ventricle: The cavity size was normal. Wall thickness was   normal. Systolic function was normal. - Atrial septum: No defect or patent foramen ovale was identified   by color flow Doppler. - Tricuspid valve: There was mild-moderate regurgitation. -  Pulmonary arteries: Systolic pressure was moderately increased.   PA peak pressure: 56 mm Hg (S). - Pericardium, extracardiac: A small pericardial effusion was   identified. 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 Her atrial fibrillation has been managed with rhythm control using Multaq and anticoagulation with Eliquis. The patient lives in [place], [place] and has been followed by a cardiologist there. This past fall she reported shortness of breath with walking up her driveway and a follow-up echocardiogram showed severe aortic stenosis. She was  told that she should consider TAVR and since her daughter lives here in new the medical community she decided to come to [facility] for evaluation. She was primarily limited by osteoarthritis at that point and Dr. Donnie Aho recommended continued surveillance. She returned to [place] but has noted progressive shortness of breath occurring with activity such as walking on flat ground or moving around in her house. She has had some lightheadedness but no syncope. She has noted some fullness in her chest with activity. She has also noted a cold sweat with activity. She denies any symptoms at rest and has had no PND orthopnea. She has had no peripheral edema. She recently returned to [facility] and was evaluated by Dr. Excell Seltzer. A follow-up echocardiogram showed a trileaflet aortic valve with moderately thickened and calcified leaflets. The mean gradient was 30 mmHg with a peak gradient of 50 mmHg. The dimensionless index was 0.4. Left ventricular ejection fraction was 60-65% with grade 1 diastolic dysfunction. There was mild mitral regurgitation. She underwent right and left heart catheterization on 08/11/2017. This showed mild nonobstructive coronary disease with normal right heart pressures. The mean transvalvular gradient was 27 mmHg with a calculated aortic valve area of 1.1 cm. The valve was difficult to cross. Given findings she was referred to [facility] for TAVR evaluation and deemed appropriate candidate.   Hospital Course   Underwent successful TAVR with Medtronic Evolut Pro THV (26mm) on 08/25/17 with 2 PreClose devices. She felt significantly better the following day. She is treated with Multaq and Eliquis as an outpatient. Maintiained SR throughout admission. Plan for ASA/Eliquis at the time of discharge. Did have expected post op anemia but Hgb remained stable at 9.0. Worked well with cardiac rehab. Follow up echo showed mild to moderate paravalvular leak with normal gradients.   Kristina Morrison was seen by Dr.  Excell Seltzer and determined stable for discharge home. Follow up in the office has been arranged. Medications are listed below.   _____________  Discharge Vitals Blood pressure 109/63, pulse 82, temperature 98.7 F (37.1 C), temperature source Oral, resp. rate (!) 21, height 5\' 5"  (1.651 m), weight 197 lb 1.5 oz (89.4 kg), SpO2 96 %.  Filed Weights   08/25/17 0851 08/26/17 0500  Weight: 199 lb (90.3 kg) 197 lb 1.5 oz (89.4 kg)    Labs & Radiologic Studies    CBC Recent Labs    08/26/17 0310 08/27/17 0252  WBC 8.5 8.7  HGB 9.3* 9.0*  HCT 29.2* 28.2*  MCV 94.2 93.1  PLT 133* 123*   Basic Metabolic Panel Recent Labs    16/10/96 0830 08/25/17 1334 08/26/17 0310  NA 142 141 140  K 4.8 4.6 3.9  CL 109  --  105  CO2 20*  --  24  GLUCOSE 131* 259*  124*  BUN 16  --  11  CREATININE 1.11*  --  0.89  CALCIUM 9.1  --  8.5*  MG  --   --  1.5*   Liver Function Tests No results for input(s): AST, ALT, ALKPHOS, BILITOT, PROT, ALBUMIN in the last 72 hours. No results for input(s): LIPASE, AMYLASE in the last 72 hours. Cardiac Enzymes No results for input(s): CKTOTAL, CKMB, CKMBINDEX, TROPONINI in the last 72 hours. BNP Invalid input(s): POCBNP D-Dimer No results for input(s): DDIMER in the last 72 hours. Hemoglobin A1C No results for input(s): HGBA1C in the last 72 hours. Fasting Lipid Panel No results for input(s): CHOL, HDL, LDLCALC, TRIG, CHOLHDL, LDLDIRECT in the last 72 hours. Thyroid Function Tests No results for input(s): TSH, T4TOTAL, T3FREE, THYROIDAB in the last 72 hours.  Invalid input(s): FREET3 _____________  Dg Chest 2 View  Result Date: 08/21/2017 CLINICAL DATA:  Preoperative respiratory exam for cardiac surgery. EXAM: CHEST  2 VIEW COMPARISON:  None. FINDINGS: Left ventricular prominence. Aortic atherosclerosis and tortuosity. The lungs are clear. The pulmonary vascularity is normal. No effusions. Chronic degenerative changes affect the spine. IMPRESSION: No  active process. Left ventricular prominence and aortic atherosclerosis. Electronically Signed   By: Paulina Fusi M.D.   On: 08/21/2017 15:22   Ct Coronary Morph W/cta Cor W/score W/ca W/cm &/or Wo/cm  Addendum Date: 08/14/2017   ADDENDUM REPORT: 08/14/2017 16:15 ADDENDUM: Please see separate dictation for contemporaneously obtained CTA chest, abdomen and pelvis dated 08/14/2016 for full description of relevant extracardiac findings. Electronically Signed   By: Trudie Reed M.D.   On: 08/14/2017 16:15   Result Date: 08/14/2017 CLINICAL DATA:  Aortic Stenosis EXAM: Cardiac TAVR CT TECHNIQUE: The patient was scanned on a Siemens Force 192 slice scanner. A 120 kV retrospective scan was triggered in the ascending thoracic aorta at 140 HU's. Gantry rotation speed was 250 msecs and collimation was .6 mm. No beta blockade or nitro were given. The 3D data set was reconstructed in 5% intervals of the R-R cycle. Systolic and diastolic phases were analyzed on a dedicated work station using MPR, MIP and VRT modes. The patient received 80 cc of contrast. FINDINGS: Aortic Valve: Tri leaflet and calcified with restricted leaflet motion Nodular calcium in the annulus at the base of the non coronary cusp Aorta: Moderate calcific aortic atherosclerosis with mild fusiform dilatation of the ascending root and arch Sino-tubular Junction: 29.5 mm Ascending Thoracic Aorta: 40 mm Aortic Arch: 36 mm Descending Thoracic Aorta: 28 mm Sinus of Valsalva Measurements: Non-coronary: 27 mm Right - coronary: 25 mm Left -   coronary: 27 mm Coronary Artery Height above Annulus: Left Main: 12 mm above annulus Right Coronary: 10.8 mm above annulus Virtual Basal Annulus Measurements: Maximum / Minimum Diameter: 18.5 mm x 24.2 mm Perimeter: 68 mm Area: 360 mm2 Coronary Arteries: Somewhat shallow but likely sufficient height above annulus for deployment Optimum Fluoroscopic Angle for Delivery: LAO 19 degrees IMPRESSION: 1. Calcified tri leaflet  AV with annulus 360 mm2 suitable for a 23 mm Sapien 3 valve Nodular calcification of annulus at base of non coronary cusp increases risk of peri valvular regurgitation 2.  Optimum angiographic angle for deployment LAO 16 degrees 3.  No LAA thrombus 4. Somewhat shallow coronary arteries but likely sufficient height above annulus for deployment 5.  Fusiform dilatation of ascending aortic root 4.0 cm Charlton Haws Electronically Signed: By: Charlton Haws M.D. On: 08/14/2017 16:08   Dg Chest Port 1 View  Result Date:  08/26/2017 CLINICAL DATA:  Reason for exam: S/P TAVR Patient alert and talking, states she is in no pain from Surgery, no SOB. A headache is her only complaint. Hx of aortic valve stenosis, CAD, Diabetic, heart murmur, HTN, hypertensive heart disease without heart failure. EXAM: PORTABLE CHEST 1 VIEW COMPARISON:  08/25/2017 FINDINGS: RIGHT IJ central line tip overlies the level of superior vena cava. Patient is slightly rotated towards the RIGHT. Heart is enlarged. TAVR. There is dense atherosclerotic calcification of the thoracic aorta. Fine reticular changes are identified throughout the lungs, consistent mild edema. No overt alveolar edema or consolidation. IMPRESSION: Mild interstitial edema. Postoperative changes. Electronically Signed   By: Norva Pavlov M.D.   On: 08/26/2017 10:27   Dg Chest Port 1 View  Result Date: 08/25/2017 CLINICAL DATA:  Status post aortic valve replacement EXAM: PORTABLE CHEST 1 VIEW COMPARISON:  August 21, 2017 FINDINGS: Central catheter tip is in the superior vena cava. No pneumothorax. There is atelectatic change in both upper lobes, slightly more on the right than on the left. There is no consolidation. Heart is mildly enlarged with pulmonary vascularity within normal limits. Transcatheter placed aortic valve replacement noted. There is aortic atherosclerosis. No adenopathy. There is a sclerotic area in the proximal right humerus, unchanged. IMPRESSION:  Transcatheter placed aortic valve replacement noted. Areas of upper lobe atelectatic change. No edema or consolidation. Heart mildly enlarged. There is aortic atherosclerosis. Sclerotic area in proximal right humerus consistent with either infarct or enchondroma. Aortic Atherosclerosis (ICD10-I70.0). Electronically Signed   By: Bretta Bang III M.D.   On: 08/25/2017 14:07   Ct Angio Chest Aorta W &/or Wo Contrast  Result Date: 08/14/2017 CLINICAL DATA:  84 year old female with history of severe aortic stenosis. Preprocedural study prior to potential TAVR procedure. EXAM: CT ANGIOGRAPHY CHEST, ABDOMEN AND PELVIS TECHNIQUE: Multidetector CT imaging through the chest, abdomen and pelvis was performed using the standard protocol during bolus administration of intravenous contrast. Multiplanar reconstructed images and MIPs were obtained and reviewed to evaluate the vascular anatomy. CONTRAST:  ISOVUE-370 IOPAMIDOL (ISOVUE-370) INJECTION 76% COMPARISON:  None. FINDINGS: CTA CHEST FINDINGS Cardiovascular: Heart size is borderline enlarged. There is no significant pericardial fluid, thickening or pericardial calcification. There is aortic atherosclerosis, as well as atherosclerosis of the great vessels of the mediastinum and the coronary arteries, including calcified atherosclerotic plaque in the left circumflex and right coronary arteries. Severe thickening calcification of the aortic valve. Severe calcifications of the mitral annulus. Ectasia of the ascending thoracic aorta which measures up to 4.1 cm in diameter. Mediastinum/Lymph Nodes: No pathologically enlarged mediastinal or hilar lymph nodes. Small hiatal hernia. No axillary lymphadenopathy. Lungs/Pleura: No suspicious appearing pulmonary nodules or masses. No acute consolidative airspace disease. No pleural effusions. Musculoskeletal/Soft Tissues: There are no aggressive appearing lytic or blastic lesions noted in the visualized portions of the  skeleton. CTA ABDOMEN AND PELVIS FINDINGS Hepatobiliary: No suspicious appearing cystic or solid hepatic lesions. No intra or extrahepatic biliary ductal dilatation. Status post cholecystectomy. Pancreas: No pancreatic mass. No pancreatic ductal dilatation. No pancreatic or peripancreatic fluid or inflammatory changes. Spleen: Unremarkable. Adrenals/Urinary Tract: 1.9 cm low-attenuation lesion in the lower pole left kidney is compatible with a simple cyst. Right kidney and bilateral adrenal glands are normal in appearance. No hydroureteronephrosis. Urinary bladder is normal in appearance. Stomach/Bowel: The appearance of the stomach is normal. No pathologic dilatation of small bowel or colon. Numerous colonic diverticulae are noted, particularly in the sigmoid colon, without surrounding inflammatory changes to suggest an  acute diverticulitis at this time. Normal appendix. Vascular/Lymphatic: Aortic atherosclerosis, without evidence of aneurysm or dissection in the abdominal or pelvic vasculature. Celiac axis, superior mesenteric artery and inferior mesenteric artery are all widely patent without hemodynamically significant stenosis. Single renal arteries bilaterally are widely patent without hemodynamically significant stenosis. No lymphadenopathy noted in the abdomen or pelvis. Reproductive: Status post hysterectomy. Ovaries are not confidently identified may be surgically absent or atrophic. Other: No significant volume of ascites.  No pneumoperitoneum. Musculoskeletal: There are no aggressive appearing lytic or blastic lesions noted in the visualized portions of the skeleton. VASCULAR MEASUREMENTS PERTINENT TO TAVR: AORTA: Minimal Aortic Diameter-15 x 15 mm Severity of Aortic Calcification-severe RIGHT PELVIS: Right Common Iliac Artery - Minimal Diameter-12 x 10.4 mm Tortuosity-mild Calcification-mild Right External Iliac Artery - Minimal Diameter-9 x 7.5 mm Tortuosity-moderate to severe Calcification - none  Right Common Femoral Artery - Minimal Diameter-8.7 x 7.7 mm Tortuosity-mild Calcification - none LEFT PELVIS: Left Common Iliac Artery - Minimal Diameter-10.6 x 9.9 mm Tortuosity-mild Calcification-mild Left External Iliac Artery - Minimal Diameter-8.9 x 7.8 mm Tortuosity-severe Calcification - none Left Common Femoral Artery - Minimal Diameter-8.3 x 8.6 mm Tortuosity-mild Calcification-none Review of the MIP images confirms the above findings. IMPRESSION: 1. Vascular findings and measurements pertinent to potential TAVR procedure, as detailed above. 2. Severe thickening calcification of the aortic valve, compatible with the reported clinical history of severe aortic stenosis. 3. Ectasia of the ascending thoracic aorta (4.1 cm in diameter). Recommend annual imaging followup by CTA or MRA. This recommendation follows 2010 ACCF/AHA/AATS/ACR/ASA/SCA/SCAI/SIR/STS/SVM Guidelines for the Diagnosis and Management of Patients with Thoracic Aortic Disease. Circulation. 2010; 121: O962-X528. 4. Colonic diverticulosis without evidence of acute diverticulitis at this time. 5. Small hiatal hernia. 6. Additional incidental findings, as above. Aortic Atherosclerosis (ICD10-I70.0). Electronically Signed   By: Trudie Reed M.D.   On: 08/14/2017 16:57   Ct Angio Abd/pel W/ And/or W/o  Result Date: 08/14/2017 CLINICAL DATA:  84 year old female with history of severe aortic stenosis. Preprocedural study prior to potential TAVR procedure. EXAM: CT ANGIOGRAPHY CHEST, ABDOMEN AND PELVIS TECHNIQUE: Multidetector CT imaging through the chest, abdomen and pelvis was performed using the standard protocol during bolus administration of intravenous contrast. Multiplanar reconstructed images and MIPs were obtained and reviewed to evaluate the vascular anatomy. CONTRAST:  ISOVUE-370 IOPAMIDOL (ISOVUE-370) INJECTION 76% COMPARISON:  None. FINDINGS: CTA CHEST FINDINGS Cardiovascular: Heart size is borderline enlarged. There is no  significant pericardial fluid, thickening or pericardial calcification. There is aortic atherosclerosis, as well as atherosclerosis of the great vessels of the mediastinum and the coronary arteries, including calcified atherosclerotic plaque in the left circumflex and right coronary arteries. Severe thickening calcification of the aortic valve. Severe calcifications of the mitral annulus. Ectasia of the ascending thoracic aorta which measures up to 4.1 cm in diameter. Mediastinum/Lymph Nodes: No pathologically enlarged mediastinal or hilar lymph nodes. Small hiatal hernia. No axillary lymphadenopathy. Lungs/Pleura: No suspicious appearing pulmonary nodules or masses. No acute consolidative airspace disease. No pleural effusions. Musculoskeletal/Soft Tissues: There are no aggressive appearing lytic or blastic lesions noted in the visualized portions of the skeleton. CTA ABDOMEN AND PELVIS FINDINGS Hepatobiliary: No suspicious appearing cystic or solid hepatic lesions. No intra or extrahepatic biliary ductal dilatation. Status post cholecystectomy. Pancreas: No pancreatic mass. No pancreatic ductal dilatation. No pancreatic or peripancreatic fluid or inflammatory changes. Spleen: Unremarkable. Adrenals/Urinary Tract: 1.9 cm low-attenuation lesion in the lower pole left kidney is compatible with a simple cyst. Right kidney and bilateral adrenal glands  are normal in appearance. No hydroureteronephrosis. Urinary bladder is normal in appearance. Stomach/Bowel: The appearance of the stomach is normal. No pathologic dilatation of small bowel or colon. Numerous colonic diverticulae are noted, particularly in the sigmoid colon, without surrounding inflammatory changes to suggest an acute diverticulitis at this time. Normal appendix. Vascular/Lymphatic: Aortic atherosclerosis, without evidence of aneurysm or dissection in the abdominal or pelvic vasculature. Celiac axis, superior mesenteric artery and inferior mesenteric  artery are all widely patent without hemodynamically significant stenosis. Single renal arteries bilaterally are widely patent without hemodynamically significant stenosis. No lymphadenopathy noted in the abdomen or pelvis. Reproductive: Status post hysterectomy. Ovaries are not confidently identified may be surgically absent or atrophic. Other: No significant volume of ascites.  No pneumoperitoneum. Musculoskeletal: There are no aggressive appearing lytic or blastic lesions noted in the visualized portions of the skeleton. VASCULAR MEASUREMENTS PERTINENT TO TAVR: AORTA: Minimal Aortic Diameter-15 x 15 mm Severity of Aortic Calcification-severe RIGHT PELVIS: Right Common Iliac Artery - Minimal Diameter-12 x 10.4 mm Tortuosity-mild Calcification-mild Right External Iliac Artery - Minimal Diameter-9 x 7.5 mm Tortuosity-moderate to severe Calcification - none Right Common Femoral Artery - Minimal Diameter-8.7 x 7.7 mm Tortuosity-mild Calcification - none LEFT PELVIS: Left Common Iliac Artery - Minimal Diameter-10.6 x 9.9 mm Tortuosity-mild Calcification-mild Left External Iliac Artery - Minimal Diameter-8.9 x 7.8 mm Tortuosity-severe Calcification - none Left Common Femoral Artery - Minimal Diameter-8.3 x 8.6 mm Tortuosity-mild Calcification-none Review of the MIP images confirms the above findings. IMPRESSION: 1. Vascular findings and measurements pertinent to potential TAVR procedure, as detailed above. 2. Severe thickening calcification of the aortic valve, compatible with the reported clinical history of severe aortic stenosis. 3. Ectasia of the ascending thoracic aorta (4.1 cm in diameter). Recommend annual imaging followup by CTA or MRA. This recommendation follows 2010 ACCF/AHA/AATS/ACR/ASA/SCA/SCAI/SIR/STS/SVM Guidelines for the Diagnosis and Management of Patients with Thoracic Aortic Disease. Circulation. 2010; 121: U981-X914: [id number]. 4. Colonic diverticulosis without evidence of acute diverticulitis at this time. 5.  Small hiatal hernia. 6. Additional incidental findings, as above. Aortic Atherosclerosis (ICD10-I70.0). Electronically Signed   By: Trudie Reedaniel  Entrikin M.D.   On: 08/14/2017 16:57   Disposition   Pt is being discharged home today in good condition.  Follow-up Plans & Appointments    Follow-up Information    Beatrice LecherWeaver, Scott T, PA-C Follow up on 09/04/2017.   Specialties:  Cardiology, Physician Assistant Why:  at 8:45am for your follow up appt.  Contact information: [address] [place] [address] [phone]          Discharge Instructions    Amb Referral to Cardiac Rehabilitation   Complete by:  As directed    Diagnosis:  Valve Replacement   Valve:  Aortic Comment - TAVR   Call MD for:  redness, tenderness, or signs of infection (pain, swelling, redness, odor or green/yellow discharge around incision site)   Complete by:  As directed    Diet - low sodium heart healthy   Complete by:  As directed    Discharge instructions   Complete by:  As directed    ACTIVITY AND EXERCISE . Daily activity and exercise are an important part of your recovery. People recover at different rates depending on their general health and type of valve procedure. . Most people require six to 10 weeks to feel recovered. . No lifting, pushing, pulling more than 10 pounds (examples to avoid: groceries, vacuuming, gardening, golfing):  - For one week with a procedure through the groin.  -  For six weeks for procedures through the chest wall.  - For three months for procedures through the breast-bone. NOTE: You will typically see one of our providers 7-10 days after your procedure to discuss WHEN TO RESUME the above activities.    DRIVING . Do not drive for four weeks after the date of your procedure. . If you have been told by your doctor in the past that you may not drive, you must talk with him/her before you begin driving again. . When you resume driving, you must have someone with  you.   DRESSING . Groin site: you may leave the clear dressing over the site for up to one week or until it falls off.   HYGIENE . If you had a femoral (leg) procedure, you may take a shower when you return home. After the shower, pat the site dry. Do NOT use powder, oils or lotions in your groin area until the site has completely healed. . If you had a chest procedure, you may shower when you return home unless specifically instructed not to by your discharging practitioner.  - DO NOT scrub incision; pat dry with a towel  - DO NOT apply any lotions, oils, powders to the incision  - No tub baths / swimming for at least six weeks. . If you notice any fevers, chills, increased pain, swelling, bleeding or pus, please contact your doctor.  ADDITIONAL INFORMATION . If you are going to have an upcoming dental procedure, please contact our office as you may require antibiotics ahead of time to prevent infection on your heart valve.   Increase activity slowly   Complete by:  As directed       Discharge Medications     Medication List    TAKE these medications   aspirin EC 81 MG tablet Take 1 tablet (81 mg total) by mouth daily.   atorvastatin 20 MG tablet Commonly known as:  LIPITOR Take 20 mg by mouth daily.   carvedilol 25 MG tablet Commonly known as:  COREG Take 25 mg by mouth daily.   Cinnamon 500 MG capsule Take 500 mg by mouth 2 (two) times daily.   dronedarone 400 MG tablet Commonly known as:  MULTAQ Take 400 mg by mouth 2 (two) times daily with a meal.   ELIQUIS 5 MG Tabs tablet Generic drug:  apixaban Take 5 mg by mouth 2 (two) times daily.   famotidine 20 MG tablet Commonly known as:  PEPCID Take 20 mg by mouth at bedtime.   Fish Oil 1000 MG Caps Take 1,000 mg by mouth 2 (two) times daily.   folic acid 400 MCG tablet Commonly known as:  FOLVITE Take 400 mcg by mouth daily.   fosinopril 20 MG tablet Commonly known as:  MONOPRIL Take 20 mg by mouth at  bedtime.   glimepiride 1 MG tablet Commonly known as:  AMARYL Take 1 mg by mouth daily with breakfast.   levothyroxine 50 MCG tablet Commonly known as:  SYNTHROID, LEVOTHROID Take 50 mcg by mouth daily before breakfast.   multivitamin with minerals Tabs tablet Take 1 tablet by mouth daily.   predniSONE 10 MG tablet Commonly known as:  DELTASONE Take 5 mg by mouth daily.   TYLENOL 8 HOUR ARTHRITIS PAIN 650 MG CR tablet Generic drug:  acetaminophen Take 650 mg by mouth every 8 (eight) hours as needed for pain.   vitamin C 500 MG tablet Commonly known as:  ASCORBIC ACID Take 500 mg by mouth  2 (two) times daily.   Vitamin D3 1000 units Caps Take 1,000 Units by mouth daily.         Outstanding Labs/Studies   Follow up Echo in a month at office visit.   Duration of Discharge Encounter   Greater than 30 minutes including physician time.  Signed, Laverda Page NP-C 08/27/2017, 12:53 PM

## 2017-08-27 NOTE — Discharge Instructions (Signed)

## 2017-08-27 NOTE — Progress Notes (Signed)
Pt walked in hallway with cane about [id number]. Tolerated well. Stated that her SOB was significantly better. Returned to [facility]. Will continue to monitor.  Versie StarksHanna  Shaquitta Burbridge, RN

## 2017-08-27 NOTE — Progress Notes (Signed)
Pt just walked with her cane not too long ago, apparently with Rn. She was told she could walk independently with her cane. Reviewed walking at home and [facility]. [facility] [place] [facility] [place] [phone] Ethelda ChickKristan Zadrian Mccauley CES, ACSM 11:15 AM 08/27/2017

## 2017-08-27 NOTE — Progress Notes (Signed)
Progress Note  Patient Name: Kristina Morrison Date of Encounter: 08/27/2017  Primary Cardiologist: No primary care provider on file.   Subjective   The patient feels great.  She is eager to go home.  Denies chest pain or shortness of breath.  Inpatient Medications    Scheduled Meds: . apixaban  5 mg Oral BID  . atorvastatin  20 mg Oral Daily  . carvedilol  25 mg Oral Daily  . Chlorhexidine Gluconate Cloth  6 each Topical Daily  . cholecalciferol  1,000 Units Oral Daily  . dronedarone  400 mg Oral BID WC  . folic acid  500 mcg Oral Daily  . glimepiride  1 mg Oral Q breakfast  . insulin aspart  0-15 Units Subcutaneous TID WC  . levothyroxine  50 mcg Oral QAC breakfast  . lisinopril  20 mg Oral Daily  . multivitamin with minerals  1 tablet Oral Daily  . omega-3 acid ethyl esters  1,000 mg Oral BID  . predniSONE  5 mg Oral Daily  . sodium chloride flush  10-40 mL Intracatheter Q12H  . vitamin C  500 mg Oral BID   Continuous Infusions:  PRN Meds: acetaminophen, sodium chloride flush   Vital Signs    Vitals:   08/26/17 1200 08/26/17 1529 08/26/17 2007 08/27/17 0600  BP: (!) 113/55 115/64 136/79 109/63  Pulse: (!) 55 65 66 82  Resp: 17 19 16  (!) 21  Temp:  98.8 F (37.1 C) 98.3 F (36.8 C) 98.7 F (37.1 C)  TempSrc:  Oral Oral Oral  SpO2: 96% 99% 94% 96%  Weight:      Height:        Intake/Output Summary (Last 24 hours) at 08/27/2017 0730 Last data filed at 08/26/2017 1757 Gross per 24 hour  Intake 540 ml  Output -  Net 540 ml   Filed Weights   08/25/17 0851 08/26/17 0500  Weight: 199 lb (90.3 kg) 197 lb 1.5 oz (89.4 kg)    Telemetry    Normal sinus rhythm, frequent PACs, no further left bundle branch block seen - Personally Reviewed   Physical Exam  Alert, oriented, pleasant elderly woman in no distress. GEN: No acute distress.   Neck: No JVD Cardiac: RRR, 2/6 early peaking soft ejection murmur at the right upper sternal border, no diastolic  murmur Respiratory: Clear to auscultation bilaterally. GI: Soft, nontender, non-distended  MS: No edema; No deformity.  Bilateral groin sites are clear Neuro:  Nonfocal  Psych: Normal affect   Labs    Chemistry Recent Labs  Lab 08/21/17 1334 08/25/17 0830 08/25/17 1334 08/26/17 0310  NA 139 142 141 140  K 4.7 4.8 4.6 3.9  CL 109 109  --  105  CO2 20* 20*  --  24  GLUCOSE 210* 131* 259* 124*  BUN 11 16  --  11  CREATININE 0.85 1.11*  --  0.89  CALCIUM 8.9 9.1  --  8.5*  PROT 6.5  --   --   --   ALBUMIN 3.6  --   --   --   AST 21  --   --   --   ALT 18  --   --   --   ALKPHOS 49  --   --   --   BILITOT 0.7  --   --   --   GFRNONAA 59* 42*  --  55*  GFRAA >60 49*  --  >60  ANIONGAP 10 13  --  11     Hematology Recent Labs  Lab 08/21/17 1334 08/25/17 1334 08/26/17 0310 08/27/17 0252  WBC 8.4  --  8.5 8.7  RBC 3.52*  --  3.10* 3.03*  HGB 10.3* 9.2* 9.3* 9.0*  HCT 33.2* 27.0* 29.2* 28.2*  MCV 94.3  --  94.2 93.1  MCH 29.3  --  30.0 29.7  MCHC 31.0  --  31.8 31.9  RDW 15.1  --  14.8 14.6  PLT 157  --  133* 123*    Cardiac EnzymesNo results for input(s): TROPONINI in the last 168 hours. No results for input(s): TROPIPOC in the last 168 hours.   BNP Recent Labs  Lab 08/25/17 0840  BNP 187.2*     DDimer No results for input(s): DDIMER in the last 168 hours.   Radiology    Dg Chest Port 1 View  Result Date: 08/26/2017 CLINICAL DATA:  Reason for exam: S/P TAVR Patient alert and talking, states she is in no pain from Surgery, no SOB. A headache is her only complaint. Hx of aortic valve stenosis, CAD, Diabetic, heart murmur, HTN, hypertensive heart disease without heart failure. EXAM: PORTABLE CHEST 1 VIEW COMPARISON:  08/25/2017 FINDINGS: RIGHT IJ central line tip overlies the level of superior vena cava. Patient is slightly rotated towards the RIGHT. Heart is enlarged. TAVR. There is dense atherosclerotic calcification of the thoracic aorta. Fine reticular  changes are identified throughout the lungs, consistent mild edema. No overt alveolar edema or consolidation. IMPRESSION: Mild interstitial edema. Postoperative changes. Electronically Signed   By: Norva Pavlov M.D.   On: 08/26/2017 10:27   Dg Chest Port 1 View  Result Date: 08/25/2017 CLINICAL DATA:  Status post aortic valve replacement EXAM: PORTABLE CHEST 1 VIEW COMPARISON:  August 21, 2017 FINDINGS: Central catheter tip is in the superior vena cava. No pneumothorax. There is atelectatic change in both upper lobes, slightly more on the right than on the left. There is no consolidation. Heart is mildly enlarged with pulmonary vascularity within normal limits. Transcatheter placed aortic valve replacement noted. There is aortic atherosclerosis. No adenopathy. There is a sclerotic area in the proximal right humerus, unchanged. IMPRESSION: Transcatheter placed aortic valve replacement noted. Areas of upper lobe atelectatic change. No edema or consolidation. Heart mildly enlarged. There is aortic atherosclerosis. Sclerotic area in proximal right humerus consistent with either infarct or enchondroma. Aortic Atherosclerosis (ICD10-I70.0). Electronically Signed   By: Bretta Bang III M.D.   On: 08/25/2017 14:07    Cardiac Studies   2D Echo 08/26/17: Study Conclusions  - Left ventricle: The cavity size was normal. There was mild   concentric hypertrophy. Systolic function was normal. The   estimated ejection fraction was in the range of 60% to 65%. Wall   motion was normal; there were no regional wall motion   abnormalities. Doppler parameters are consistent with abnormal   left ventricular relaxation (grade 1 diastolic dysfunction).   Doppler parameters are consistent with high ventricular filling   pressure. - Aortic valve: A 26 mm Corevalve Evolut Pro TAVR bioprosthesis was   present. Transvalvular velocity was within the normal range.   There was no stenosis. There was moderate  perivalvular   regurgitation. Peak velocity (S): 188 cm/s. Mean gradient (S): 8   mm Hg. Valve area (VTI): 1.53 cm^2. Valve area (Vmax): 1.56 cm^2.   Valve area (Vmean): 1.63 cm^2. - Mitral valve: Calcified annulus. Transvalvular velocity was   within the normal range. There was no evidence for stenosis.  There was mild regurgitation. Valve area by pressure half-time:   1.68 cm^2. - Left atrium: The atrium was mildly to moderately dilated. - Right ventricle: The cavity size was normal. Wall thickness was   normal. Systolic function was normal. - Atrial septum: No defect or patent foramen ovale was identified   by color flow Doppler. - Tricuspid valve: There was mild-moderate regurgitation. - Pulmonary arteries: Systolic pressure was moderately increased.   PA peak pressure: 56 mm Hg (S). - Pericardium, extracardiac: A small pericardial effusion was   identified.  Patient Profile     84 y.o. female with severe symptomatic aortic stenosis status post TAVR 08/25/2017  Assessment & Plan    1.  Severe symptomatically aortic stenosis: The patient has done very well.  I personally reviewed her echo images.  She has normal transvalvular gradients.  There is a mild to moderate paravalvular leak which I cannot appreciate on her exam at all and with normal gradients I suspect this is in the mild range.  She will have a repeat echo in 30 days.  She is treated with apixaban in the setting of paroxysmal atrial fibrillation and has been started on aspirin 81 mg daily.  She should have a transition of care visit in 1 week.  2.  Paroxysmal atrial fibrillation: Continue Multaq for rhythm control and apixaban for anticoagulation  3.  Anemia, postoperative blood loss: Stable on follow-up hemoglobin.  No signs of bleeding.  4. Type II DM: resume home regimen at discharge. CBG's reviewed.   Disposition: stable for discharge today.  For questions or updates, please contact [facility] Please consult  [email or web address] for contact info under Cardiology/STEMI.      Signed, Tonny Bollman, MD  08/27/2017, 7:30 AM

## 2017-08-27 NOTE — Progress Notes (Signed)
Discharge instructions given to patient, including incision care and medications. All questions answered. Iv removed, clean and intact. Telemetry removed. Daughter to take pt home.  Versie StarksHanna  [place], RN

## 2017-08-27 NOTE — Progress Notes (Signed)
Patient A/O x4; No C/o of pain; VSS; Dressing clean and intact; Will continue to monitor.

## 2017-08-27 NOTE — Progress Notes (Incomplete)
Patient ambulated  hallway

## 2017-08-28 ENCOUNTER — Telehealth: Payer: Self-pay

## 2017-08-28 NOTE — Telephone Encounter (Signed)
[facility].  Patient understands to follow up with provider Tereso NewcomerScott Weaver PA-C on 09/04/2017 at 8:45 AM at [address] location. Patient understands discharge instructions? yes Patient understands medications and regiment? yes Patient understands to bring all medications to this visit? yes  Per pt's daughter Olegario MessierKathy the pt is doing well since discharge.  She will contact the office with any questions or concerns.

## 2017-08-28 NOTE — Telephone Encounter (Signed)
1st TOC attempt.  Left message on [name]'s voicemail to contact me in regards to how the pt is doing since discharge.

## 2017-09-01 ENCOUNTER — Encounter: Payer: Medicare Other | Admitting: Thoracic Surgery (Cardiothoracic Vascular Surgery)

## 2017-09-03 DIAGNOSIS — Z953 Presence of xenogenic heart valve: Secondary | ICD-10-CM | POA: Insufficient documentation

## 2017-09-03 NOTE — Progress Notes (Signed)
Cardiology Office Note:    Date:  09/04/2017   ID:  Kristina Morrison, DOB [birth year], MRN [id number]  PCP:  [facility]  Cardiologist:  Dr. Ezzard Standing  TAVR: Dr. Sherren Mocha    Referring MD: Guinevere Ferrari & Lilyan Punt*   Chief Complaint  Patient presents with  . Hospitalization Follow-up    Status post TAVR    History of Present Illness:    Kristina Morrison is a 84 y.o. female with paroxysmal atrial fibrillation, aortic stenosis, hypertension, hyperlipidemia.  She was evaluated recently by Dr. Sherren Mocha for TAVR because of symptomatic aortic stenosis.  She was admitted 2/26-2/28 and underwent TAVR via transfemoral approach with a Medtronic Evolut ProTHV.  Her post op course was fairly uneventful.    Kristina Morrison returns for post hospitalization follow up.  She is here today with her daughter.  Since discharge, she has been doing fairly well.  She did not do much activity for the first several days.  She has been trying to walk for the last few days.  She has noted some shortness of breath.  It does not stop her from activity.  She denies orthopnea or PND.  She denies coughing or wheezing.  She denies chest discomfort or syncope.  She has not been weighing herself.  She has not been checking her blood pressure.  She denies any bleeding issues.  Prior CV studies:   The following studies were reviewed today:  Echo 08/26/17 Mild conc LVH, EF 60-65, no RWMA, Gr 1 DD, s/p TAVR with mod perivalvular regurgitation (mean 8 mmHg), MAC, mild MR, mild to mod LAE, mild to mod TR, PASP 56, small pericardial eff  Carotid US 08/14/17 Bilateral ICA 1-39  Cardiac Catheterization 08/11/17 LM luminal irregs LAD luminal irregs LCx luminal irregs RCA mid 30 1. Mild nonobstructive CAD 2. Normal right heart pressures and normal PCWP 3. Moderate-severe aortic stenosis with mean transvalvular gradient 27 mmHg, calculated AVA 1.1 square cm (aortic valve is severely calcified and  restricted on fluoroscopy and is very difficult to cross)  Echo 08/06/17 EF 60-65, no RWMA, Gr 1 DD, mod AS (mean 30, peak 50), mild AI, MAC, mild MR, mild LAE, trivial TR, PASP 29   Past Medical History:  Diagnosis Date  . Aortic valve stenosis   . Arthritis    back & neck & knees   . Carpal tunnel syndrome   . Coronary artery disease   . Diabetes mellitus without complication ([place])    type 2   . H/O hernia repair   . Heart murmur   . Hyperlipidemia   . Hypertension   . Hypertensive heart disease without heart failure   . Hypothyroidism   . Long term (current) use of anticoagulants   . Lumbar disc disease   . Obesity   . Paroxysmal atrial fibrillation (HCC)   . SOB (shortness of breath)     Past Surgical History:  Procedure Laterality Date  . ABDOMINAL HYSTERECTOMY    . BREAST BIOPSY Left   . CARPAL TUNNEL RELEASE    . CERVICAL FUSION    . CHOLECYSTECTOMY    . EYE SURGERY Bilateral    /w IOL  . HERNIA REPAIR     inguinal  . JOINT REPLACEMENT Bilateral    R partial knee replacement, L knee complete replacement, with drill bit left in tibia from replacement surgery  . LUMBAR LAMINECTOMY    . REPLACEMENT TOTAL KNEE Bilateral   .  RIGHT/LEFT HEART CATH AND CORONARY ANGIOGRAPHY N/A 08/11/2017   Procedure: RIGHT/LEFT HEART CATH AND CORONARY ANGIOGRAPHY;  Surgeon: Sherren Mocha, MD;  Location: [facility];  Service: Cardiovascular;  Laterality: N/A;  . TEE WITHOUT CARDIOVERSION N/A 08/25/2017   Procedure: TRANSESOPHAGEAL ECHOCARDIOGRAM (TEE);  Surgeon: Sherren Mocha, MD;  Location: [place];  Service: Open Heart Surgery;  Laterality: N/A;  . TONSILLECTOMY    . TRANSCATHETER AORTIC VALVE REPLACEMENT, TRANSFEMORAL N/A 08/25/2017   Procedure: TRANSCATHETER AORTIC VALVE REPLACEMENT, TRANSFEMORAL;  Surgeon: Sherren Mocha, MD;  Location: [place];  Service: Open Heart Surgery;  Laterality: N/A;    Current Medications: Current Meds  Medication Sig  . acetaminophen (TYLENOL  8 HOUR ARTHRITIS PAIN) 650 MG CR tablet Take 650 mg by mouth every 8 (eight) hours as needed for pain.  Marland Kitchen apixaban (ELIQUIS) 5 MG TABS tablet Take 5 mg by mouth 2 (two) times daily.  Marland Kitchen aspirin EC 81 MG tablet Take 1 tablet (81 mg total) by mouth daily.  Marland Kitchen atorvastatin (LIPITOR) 20 MG tablet Take 20 mg by mouth daily.   . carvedilol (COREG) 25 MG tablet Take 25 mg by mouth daily.   . Cholecalciferol (VITAMIN D3) 1000 units CAPS Take 1,000 Units by mouth daily.   . Cinnamon 500 MG capsule Take 500 mg by mouth 2 (two) times daily.  Marland Kitchen dronedarone (MULTAQ) 400 MG tablet Take 400 mg by mouth 2 (two) times daily with a meal.  . famotidine (PEPCID) 20 MG tablet Take 20 mg by mouth at bedtime.   . folic acid (FOLVITE) 366 MCG tablet Take 400 mcg by mouth daily.  . fosinopril (MONOPRIL) 20 MG tablet Take 20 mg by mouth at bedtime.   Marland Kitchen glimepiride (AMARYL) 1 MG tablet Take 1 mg by mouth daily with breakfast.   . levothyroxine (SYNTHROID, LEVOTHROID) 50 MCG tablet Take 50 mcg by mouth daily before breakfast.  . Multiple Vitamin (MULTIVITAMIN WITH MINERALS) TABS tablet Take 1 tablet by mouth daily.  . Omega-3 Fatty Acids (FISH OIL) 1000 MG CAPS Take 1,000 mg by mouth 2 (two) times daily.   . predniSONE (DELTASONE) 10 MG tablet Take 5 mg by mouth daily.   . vitamin C (ASCORBIC ACID) 500 MG tablet Take 500 mg by mouth 2 (two) times daily.     Allergies:   Cinobac [cinoxacin]; Penicillins; and Sulfa antibiotics   Social History   Tobacco Use  . Smoking status: Former Smoker    Last attempt to quit: 08/05/1958    Years since quitting: 59.1  . Smokeless tobacco: Never Used  Substance Use Topics  . Alcohol use: No    Frequency: Never  . Drug use: No     Family Hx: The patient's family history includes Breast cancer in her sister; CVA in her mother; Heart attack in her father.  ROS:   Please see the history of present illness.    Review of Systems  HENT: Positive for hearing loss.    All other  systems reviewed and are negative.   EKGs/Labs/Other Test Reviewed:    EKG:  EKG is  ordered today.  The ekg ordered today demonstrates normal sinus rhythm, heart rate 67, normal axis, PVCs, PACs, QRS 118, QTC 458  Recent Labs: 08/21/2017: ALT 18 08/25/2017: B Natriuretic Peptide 187.2 08/26/2017: BUN 11; Creatinine, Ser 0.89; Magnesium 1.5; Potassium 3.9; Sodium 140 08/27/2017: Hemoglobin 9.0; Platelets 123   Recent Lipid Panel No results found for: CHOL, TRIG, HDL, CHOLHDL, LDLCALC, LDLDIRECT  Physical Exam:  VS:  BP (!) 152/68   Pulse 67   Ht 5' 5"  (1.651 m)   Wt 203 lb (92.1 kg)   BMI 33.78 kg/m     Wt Readings from Last 3 Encounters:  09/04/17 203 lb (92.1 kg)  08/26/17 197 lb 1.5 oz (89.4 kg)  08/21/17 199 lb 3.2 oz (90.4 kg)     Physical Exam  Constitutional: She is oriented to person, place, and time. She appears well-developed and well-nourished. No distress.  HENT:  Head: Normocephalic and atraumatic.  Neck: No JVD present.  Cardiovascular: Normal rate. A regularly irregular rhythm present.  Murmur heard.  Low-pitched systolic murmur is present with a grade of 1/6 at the upper right sternal border. Pulmonary/Chest: Effort normal. She has no rales.  Abdominal: Soft.  Musculoskeletal: She exhibits edema.  Neurological: She is alert and oriented to person, place, and time.  Skin: Skin is warm and dry.    ASSESSMENT & PLAN:    #1.  S/p TAVR (transcatheter aortic valve replacement), bioprosthetic She is progressing well after recent TAVR.  She has been a little short of breath recently.  However, she does have significant osteoarthritis and was somewhat inactive for a few days.  I suspect that this is related to deconditioning.  She does not appear to be volume overloaded on exam.  I have encouraged her to continue to increase her activity.  Continue aspirin in addition to Apixaban.  She has follow-up later this month with a follow-up echocardiogram.  We discussed  the importance of SBE prophylaxis.  #2.  PAF (paroxysmal atrial fibrillation) (HCC)  Maintaining normal sinus rhythm.  She remains on Multitak and apixaban.  Her current dose of Apixaban is correct with weight greater than 60 kg and creatinine less than 1.5.  She denies any bleeding issues.  Check follow-up BMET, CBC today.  #3.  Essential hypertension  Blood pressure somewhat borderline today.  I have asked her to continue to monitor this.  She was previously on amlodipine.  If it continues to run high, consider resuming amlodipine.  #4.  PVC's (premature ventricular contractions)  She has frequent PVCs on her ECG.  She is not symptomatic.  They are noted on exam as well.  Obtain BMET, magnesium.   Dispo:  Return in about 3 weeks (around 09/23/2017) for Scheduled Follow Up, w/ Dr. Burt Knack.   Medication Adjustments/Labs and Tests Ordered: Current medicines are reviewed at length with the patient today.  Concerns regarding medicines are outlined above.  Tests Ordered: Orders Placed This Encounter  Procedures  . Basic Metabolic Panel (BMET)  . CBC  . Magnesium  . EKG 12-Lead   Medication Changes: No orders of the defined types were placed in this encounter.   Signed, Richardson Dopp, PA-C  09/04/2017 2:12 PM    [facility] [place], [place], [place]  [address] Phone: [phone]; Fax: [phone]

## 2017-09-04 ENCOUNTER — Encounter: Payer: Self-pay | Admitting: Physician Assistant

## 2017-09-04 ENCOUNTER — Ambulatory Visit (INDEPENDENT_AMBULATORY_CARE_PROVIDER_SITE_OTHER): Payer: Medicare Other | Admitting: Physician Assistant

## 2017-09-04 VITALS — BP 152/68 | HR 67 | Ht 65.0 in | Wt 203.0 lb

## 2017-09-04 DIAGNOSIS — Z953 Presence of xenogenic heart valve: Secondary | ICD-10-CM

## 2017-09-04 DIAGNOSIS — I493 Ventricular premature depolarization: Secondary | ICD-10-CM | POA: Diagnosis not present

## 2017-09-04 DIAGNOSIS — I480 Paroxysmal atrial fibrillation: Secondary | ICD-10-CM

## 2017-09-04 DIAGNOSIS — I10 Essential (primary) hypertension: Secondary | ICD-10-CM | POA: Diagnosis not present

## 2017-09-04 LAB — CBC
HEMATOCRIT: 30.8 % — AB (ref 34.0–46.6)
Hemoglobin: 9.9 g/dL — ABNORMAL LOW (ref 11.1–15.9)
MCH: 29.7 pg (ref 26.6–33.0)
MCHC: 32.1 g/dL (ref 31.5–35.7)
MCV: 93 fL (ref 79–97)
PLATELETS: 205 10*3/uL (ref 150–379)
RBC: 3.33 x10E6/uL — AB (ref 3.77–5.28)
RDW: 14.3 % (ref 12.3–15.4)
WBC: 8.3 10*3/uL (ref 3.4–10.8)

## 2017-09-04 LAB — BASIC METABOLIC PANEL
BUN / CREAT RATIO: 18 (ref 12–28)
BUN: 17 mg/dL (ref 10–36)
CO2: 22 mmol/L (ref 20–29)
CREATININE: 0.92 mg/dL (ref 0.57–1.00)
Calcium: 8.9 mg/dL (ref 8.7–10.3)
Chloride: 106 mmol/L (ref 96–106)
GFR calc Af Amer: 63 mL/min/{1.73_m2} (ref >59)
GFR calc non Af Amer: 55 mL/min/{1.73_m2} — ABNORMAL LOW (ref >59)
Glucose: 204 mg/dL — ABNORMAL HIGH (ref 65–99)
Potassium: 4.1 mmol/L (ref 3.5–5.2)
SODIUM: 140 mmol/L (ref 134–144)

## 2017-09-04 LAB — MAGNESIUM: Magnesium: 1.8 mg/dL (ref 1.6–2.3)

## 2017-09-04 NOTE — Patient Instructions (Signed)
Medication Instructions:  1. Your physician recommends that you continue on your current medications as directed. Please refer to the Current Medication list given to you today.   Labwork: TODAY BMET, CBC , MAGNESIUM LEVEL  Testing/Procedures: NONE ORDERED TODAY  Follow-Up: KEEP YOUR APPT WITH DR. Excell SeltzerOOPER   Any Other Special Instructions Will Be Listed Below (If Applicable).  MONITOR YOUR BLOOD PRESSURE AND CALL IF CONSISTENTLY 140/90 OR HIGHER [phone]   If you need a refill on your cardiac medications before your next appointment, please call your pharmacy.

## 2017-09-07 ENCOUNTER — Telehealth: Payer: Self-pay | Admitting: *Deleted

## 2017-09-07 NOTE — Telephone Encounter (Signed)
-----   Message from Beatrice LecherScott T Weaver, [place] sent at 09/04/2017  4:47 PM EST ----- Renal function, potassium, magnesium normal.  Glucose elevated. The hemoglobin is stable.  Continue current medications and follow up as planned.  Tereso NewcomerScott Weaver, PA-C    09/04/2017 4:46 PM

## 2017-09-07 NOTE — Telephone Encounter (Signed)
Left message to go over lab results.  

## 2017-09-09 ENCOUNTER — Telehealth: Payer: Self-pay

## 2017-09-09 NOTE — Telephone Encounter (Signed)
-----   Message from Scott T Weaver, PA-C sent at 09/04/2017  4:47 PM EST ----- Renal function, potassium, magnesium normal.  Glucose elevated. The hemoglobin is stable.  Continue current medications and follow up as planned.  Scott Weaver, PA-C    09/04/2017 4:46 PM 

## 2017-09-09 NOTE — Telephone Encounter (Signed)
Lmom lab work ok. Glucose elevated. No changes to be made with medications at this time. If any questions please call [phone].

## 2017-09-09 NOTE — Telephone Encounter (Signed)
Late Entry:  The pt's daughter contacted me by phone on 3/11 in regards to the pt having some blood in stool (bright red streaks on BM) like she had 2 months ago when she had a colonoscopy. The pt denies hemorrhoids.  Olegario MessierKathy questioned if the pt should decrease Eliquis dose.  I spoke with Dr Excell Seltzerooper and he advised that pt should continue Eliquis, Stop ASA and have CBC rechecked this week.  I made Dr Excell Seltzerooper aware that CBC was just checked on 09/04/17 and Hgb was stable at 9.9.  He advised that pt make medication change and continue to monitor for bleeding.  If the pt continues to blood in stool then we can check CBC again.  Olegario MessierKathy agreed with plan.

## 2017-09-17 ENCOUNTER — Encounter: Payer: Self-pay | Admitting: Thoracic Surgery (Cardiothoracic Vascular Surgery)

## 2017-09-23 ENCOUNTER — Ambulatory Visit (HOSPITAL_COMMUNITY): Payer: Medicare Other | Attending: Cardiology

## 2017-09-23 ENCOUNTER — Encounter: Payer: Self-pay | Admitting: Cardiovascular Disease

## 2017-09-23 ENCOUNTER — Ambulatory Visit (INDEPENDENT_AMBULATORY_CARE_PROVIDER_SITE_OTHER): Payer: Medicare Other | Admitting: Cardiovascular Disease

## 2017-09-23 ENCOUNTER — Other Ambulatory Visit: Payer: Self-pay

## 2017-09-23 ENCOUNTER — Other Ambulatory Visit: Payer: Self-pay | Admitting: Cardiovascular Disease

## 2017-09-23 VITALS — BP 168/68 | HR 68 | Ht 65.0 in

## 2017-09-23 DIAGNOSIS — Z952 Presence of prosthetic heart valve: Secondary | ICD-10-CM

## 2017-09-23 DIAGNOSIS — I350 Nonrheumatic aortic (valve) stenosis: Secondary | ICD-10-CM

## 2017-09-23 DIAGNOSIS — I081 Rheumatic disorders of both mitral and tricuspid valves: Secondary | ICD-10-CM | POA: Insufficient documentation

## 2017-09-23 DIAGNOSIS — E785 Hyperlipidemia, unspecified: Secondary | ICD-10-CM | POA: Insufficient documentation

## 2017-09-23 DIAGNOSIS — I119 Hypertensive heart disease without heart failure: Secondary | ICD-10-CM | POA: Insufficient documentation

## 2017-09-23 DIAGNOSIS — I4891 Unspecified atrial fibrillation: Secondary | ICD-10-CM | POA: Insufficient documentation

## 2017-09-23 LAB — CBC WITH DIFFERENTIAL/PLATELET
BASOS: 0 %
Basophils Absolute: 0 10*3/uL (ref 0.0–0.2)
EOS (ABSOLUTE): 0.3 10*3/uL (ref 0.0–0.4)
EOS: 4 %
HEMATOCRIT: 29.6 % — AB (ref 34.0–46.6)
HEMOGLOBIN: 9.5 g/dL — AB (ref 11.1–15.9)
IMMATURE GRANULOCYTES: 0 %
Immature Grans (Abs): 0 10*3/uL (ref 0.0–0.1)
Lymphocytes Absolute: 1.3 10*3/uL (ref 0.7–3.1)
Lymphs: 17 %
MCH: 29.3 pg (ref 26.6–33.0)
MCHC: 32.1 g/dL (ref 31.5–35.7)
MCV: 91 fL (ref 79–97)
MONOCYTES: 13 %
Monocytes Absolute: 1 10*3/uL — ABNORMAL HIGH (ref 0.1–0.9)
NEUTROS PCT: 66 %
Neutrophils Absolute: 5.4 10*3/uL (ref 1.4–7.0)
Platelets: 168 10*3/uL (ref 150–379)
RBC: 3.24 x10E6/uL — AB (ref 3.77–5.28)
RDW: 14.6 % (ref 12.3–15.4)
WBC: 8 10*3/uL (ref 3.4–10.8)

## 2017-09-23 MED ORDER — AMLODIPINE BESYLATE 5 MG PO TABS: 5.0000 mg | ORAL_TABLET | Freq: Every day | ORAL | 3 refills | Status: DC

## 2017-09-23 NOTE — Progress Notes (Signed)
Cardiology Office Note Date:  09/23/2017   ID:  Kristina Morrison, DOB 1924-11-12, MRN [id number]  PCP:  [facility]  Cardiologist:  Tonny Bollman, MD    Chief Complaint  Patient presents with  . Shortness of Breath     History of Present Illness: Kristina Morrison is a 84 y.o. female who presents for 30 day TAVR follow-up. She has a hx of PAF, HTN, and hyperlipidemia She underwent TAVR with a 26 mm Medronic Evolut Pro THV on 08-25-2017 with an uncomplicated postoperative course.   The patient is here with her daughter today.  She is doing fairly well.  Still has some degree of dyspnea with exertion.  She is been walking 10 laps around the house on a daily basis.  She denies chest pain, chest pressure, orthopnea, PND, or heart palpitations.  She has had no lightheadedness or syncope.  She is tolerating her medicines without problems.  She did report some blood in her stool when she was taking both apixaban and aspirin.  After she called in, we decided to stop her aspirin.  Symptoms have resolved since that time.  The patient plans to return back to her home in [place] next week.  Past Medical History:  Diagnosis Date  . Aortic valve stenosis   . Arthritis    back & neck & knees   . Carpal tunnel syndrome   . Coronary artery disease   . Diabetes mellitus without complication (HCC)    type 2   . H/O hernia repair   . Heart murmur   . Hyperlipidemia   . Hypertension   . Hypertensive heart disease without heart failure   . Hypothyroidism   . Long term (current) use of anticoagulants   . Lumbar disc disease   . Obesity   . Paroxysmal atrial fibrillation (HCC)   . SOB (shortness of breath)     Past Surgical History:  Procedure Laterality Date  . ABDOMINAL HYSTERECTOMY    . BREAST BIOPSY Left   . CARPAL TUNNEL RELEASE    . CERVICAL FUSION    . CHOLECYSTECTOMY    . EYE SURGERY Bilateral    /w IOL  . HERNIA REPAIR     inguinal  . JOINT REPLACEMENT Bilateral     R partial knee replacement, L knee complete replacement, with drill bit left in tibia from replacement surgery  . LUMBAR LAMINECTOMY    . REPLACEMENT TOTAL KNEE Bilateral   . RIGHT/LEFT HEART CATH AND CORONARY ANGIOGRAPHY N/A 08/11/2017   Procedure: RIGHT/LEFT HEART CATH AND CORONARY ANGIOGRAPHY;  Surgeon: Tonny Bollman, MD;  Location: [facility] INVASIVE CV LAB;  Service: Cardiovascular;  Laterality: N/A;  . TEE WITHOUT CARDIOVERSION N/A 08/25/2017   Procedure: TRANSESOPHAGEAL ECHOCARDIOGRAM (TEE);  Surgeon: Tonny Bollman, MD;  Location: [facility] OR;  Service: Open Heart Surgery;  Laterality: N/A;  . TONSILLECTOMY    . TRANSCATHETER AORTIC VALVE REPLACEMENT, TRANSFEMORAL N/A 08/25/2017   Procedure: TRANSCATHETER AORTIC VALVE REPLACEMENT, TRANSFEMORAL;  Surgeon: Tonny Bollman, MD;  Location: [facility] OR;  Service: Open Heart Surgery;  Laterality: N/A;    Current Outpatient Medications  Medication Sig Dispense Refill  . acetaminophen (TYLENOL 8 HOUR ARTHRITIS PAIN) 650 MG CR tablet Take 650 mg by mouth every 8 (eight) hours as needed for pain.    Marland Kitchen apixaban (ELIQUIS) 5 MG TABS tablet Take 5 mg by mouth 2 (two) times daily.    Marland Kitchen atorvastatin (LIPITOR) 20 MG tablet Take 20 mg by mouth daily.     Marland Kitchen  carvedilol (COREG) 25 MG tablet Take 25 mg by mouth daily.     . Cholecalciferol (VITAMIN D3) 1000 units CAPS Take 1,000 Units by mouth daily.     . Cinnamon 500 MG capsule Take 500 mg by mouth 2 (two) times daily.    Marland Kitchen. dronedarone (MULTAQ) 400 MG tablet Take 400 mg by mouth 2 (two) times daily with a meal.    . famotidine (PEPCID) 20 MG tablet Take 20 mg by mouth at bedtime.     . folic acid (FOLVITE) 400 MCG tablet Take 400 mcg by mouth daily.    . fosinopril (MONOPRIL) 20 MG tablet Take 20 mg by mouth at bedtime.     Marland Kitchen. glimepiride (AMARYL) 1 MG tablet Take 1 mg by mouth daily with breakfast.   0  . levothyroxine (SYNTHROID, LEVOTHROID) 50 MCG tablet Take 50 mcg by mouth daily before breakfast.    . Multiple  Vitamin (MULTIVITAMIN WITH MINERALS) TABS tablet Take 1 tablet by mouth daily.    . Omega-3 Fatty Acids (FISH OIL) 1000 MG CAPS Take 1,000 mg by mouth 2 (two) times daily.     . vitamin C (ASCORBIC ACID) 500 MG tablet Take 500 mg by mouth 2 (two) times daily.     No current facility-administered medications for this visit.     Allergies:   Cinobac [cinoxacin]; Penicillins; and Sulfa antibiotics   Social History:  The patient  reports that she quit smoking about 59 years ago. She has never used smokeless tobacco. She reports that she does not drink alcohol or use drugs.   Family History:  The patient's family history includes Breast cancer in her sister; CVA in her mother; Heart attack in her father.    ROS:  Please see the history of present illness.  Otherwise, review of systems is positive for back pain.  All other systems are reviewed and negative.    PHYSICAL EXAM: VS:  BP (!) 168/68   Pulse 68   Ht 5\' 5"  (1.651 m)   SpO2 98%   BMI 33.78 kg/m  , BMI Body mass index is 33.78 kg/m. GEN: Well nourished, well developed, in no acute distress  HEENT: normal  Neck: no JVD, no masses Cardiac: RRR with 2/6 systolic murmur at the RUSB, no diastolic murmur             Respiratory:  clear to auscultation bilaterally, normal work of breathing GI: soft, nontender, nondistended, + BS MS: no deformity or atrophy  Ext: no pretibial edema, pedal pulses 2+= bilaterally Skin: warm and dry, no rash Neuro:  Strength and sensation are intact Psych: euthymic mood, full affect  EKG:  EKG is not ordered today.  Recent Labs: 08/21/2017: ALT 18 08/25/2017: B Natriuretic Peptide 187.2 09/04/2017: BUN 17; Creatinine, Ser 0.92; Hemoglobin 9.9; Magnesium 1.8; Platelets 205; Potassium 4.1; Sodium 140   Lipid Panel  No results found for: CHOL, TRIG, HDL, CHOLHDL, VLDL, LDLCALC, LDLDIRECT    Wt Readings from Last 3 Encounters:  09/04/17 203 lb (92.1 kg)  08/26/17 197 lb 1.5 oz (89.4 kg)  08/21/17 199  lb 3.2 oz (90.4 kg)     Cardiac Studies Reviewed: Today's echo: Study Conclusions  - Left ventricle: The cavity size was normal. Wall thickness was   increased in a pattern of mild LVH. Systolic function was normal.   The estimated ejection fraction was in the range of 60% to 65%.   Wall motion was normal; there were no regional wall motion  abnormalities. Doppler parameters are consistent with abnormal   left ventricular relaxation (grade 1 diastolic dysfunction). - Aortic valve: s/p Medtronic Evolut Pro THV TAVR. No obstruction.   There was trivial paravalvular leak. Mean gradient (S): 9 mm Hg.   Peak gradient (S): 16 mm Hg. Valve area (VTI): 1.92 cm^2. Valve   area (Vmax): 1.84 cm^2. Valve area (Vmean): 1.87 cm^2. - Mitral valve: Calcified annulus. Moderately thickened leaflets .   Mild to moderate regurgitation. Valve area by continuity equation   (using LVOT flow): 2.05 cm^2. - Left atrium: Severely dilated. - Right atrium: The atrium was normal in size. - Tricuspid valve: There was mild regurgitation. - Pulmonary arteries: PA peak pressure: 42 mm Hg (S). - Inferior vena cava: The vessel was normal in size. The   respirophasic diameter changes were in the normal range (>= 50%),   consistent with normal central venous pressure.  Impressions:  - Compared to a prior study in 07/2017, the paravalvular leak is now   trivial. The TAVR valve appears normal without obstruction.  ASSESSMENT AND PLAN: 84 year old woman with New York Heart Association functional class II symptoms of chronic diastolic heart failure and aortic valve disease now approximately 1 month out from uncomplicated TAVR with a 26 mm Medtronic Evolut transcatheter heart valve.  I personally reviewed her echo images today.  The patient has normal function of her transcatheter heart valve with a mean transvalvular gradient of 9 mmHg and only trivial paravalvular regurgitation.  LV function is normal.  I think she is  stable to return to her home in [place].  Will arrange a one-year follow-up visit and echocardiogram.  She has noted increased blood pressures at home and will start her back on her home dose of amlodipine 5 mg daily.  We will check a CBC since she had some blood in her stool but it sounds like this has resolved since she discontinued aspirin.  She will continue on apixaban for anticoagulation.  She is advised to call if any problems arise.  She understands to follow SBE prophylaxis per guidelines.  Current medicines are reviewed with the patient today.  The patient does not have concerns regarding medicines.  Labs/ tests ordered today include:  No orders of the defined types were placed in this encounter.  Disposition:   FU 12 months  Signed, Tonny Bollman, MD  09/23/2017 9:49 AM    [facility] [address] [place], [place], [place]  [address] Phone: [phone]; Fax: [phone]

## 2017-09-23 NOTE — Patient Instructions (Signed)
Medication Instructions:  1) RESTART AMLODIPINE 5 mg daily  Labwork: TODAY: CBC  Testing/Procedures: Your provider has requested that you have an echocardiogram in 1 year. Echocardiography is a painless test that uses sound waves to create images of your heart. It provides your doctor with information about the size and shape of your heart and how well your heart's chambers and valves are working. This procedure takes approximately one hour. There are no restrictions for this procedure.  Follow-Up: Your provider wants you to follow-up in: 1 year with Carlean JewsKatie Jewkes, PA. You will receive a reminder letter in the mail two months in advance. If you don't receive a letter, please call our office to schedule the follow-up appointment.    Any Other Special Instructions Will Be Listed Below (If Applicable).     If you need a refill on your cardiac medications before your next appointment, please call your pharmacy.

## 2017-10-05 ENCOUNTER — Encounter: Payer: Self-pay | Admitting: Thoracic Surgery (Cardiothoracic Vascular Surgery)

## 2018-04-30 ENCOUNTER — Other Ambulatory Visit: Payer: Self-pay | Admitting: Cardiovascular Disease

## 2018-05-18 ENCOUNTER — Other Ambulatory Visit: Payer: Self-pay

## 2018-05-18 DIAGNOSIS — I350 Nonrheumatic aortic (valve) stenosis: Secondary | ICD-10-CM

## 2018-05-18 DIAGNOSIS — Z952 Presence of prosthetic heart valve: Secondary | ICD-10-CM

## 2018-07-06 NOTE — Progress Notes (Addendum)
[facility]   MULTIDISCIPLINARY HEART VALVE CLINIC                                       Cardiology Office Note    Date:  07/08/2018   ID:  Kristina Morrison, DOB [birth year], MRN [id number]  PCP:  Kristina Morrison  Cardiologist:  Followed by Dr. Perlie Mayo in [facility]   CC: 1 year s/p TAVR  History of Present Illness:  Kristina Morrison is a 85 y.o. female with a history of PAF on Eliquis, HTN, HLD, and severe AS s/p TAVR (08/25/17) who presents to clinic for follow up.   She underwent TAVR with a 26 mm Medronic Evolut Pro THV on 08-25-2017 with an uncomplicated postoperative course. She was discharged on Aspirin and Eliquis, but aspirin was later discontinued due to blood in her stool. One month echo showed normal LV function and function of her transcatheter heart valve with a mean transvalvular gradient of 9 mmHg and only trivial paravalvular regurgitation.   Today she presents to clinic for follow up. She is doing quite well. Her only complaint is knee in and back pain. No CP or SOB. No LE edema, orthopnea or PND. No dizziness or syncope. No blood in stool or urine. No palpitations.    Past Medical History:  Diagnosis Date  . Aortic valve stenosis   . Arthritis    back & neck & knees   . Carpal tunnel syndrome   . Coronary artery disease   . Diabetes mellitus without complication (HCC)    type 2   . H/O hernia repair   . Heart murmur   . Hyperlipidemia   . Hypertension   . Hypertensive heart disease without heart failure   . Hypothyroidism   . Long term (current) use of anticoagulants   . Lumbar disc disease   . Obesity   . Paroxysmal atrial fibrillation (HCC)   . SOB (shortness of breath)     Past Surgical History:  Procedure Laterality Date  . ABDOMINAL HYSTERECTOMY    . BREAST BIOPSY Left   . CARPAL TUNNEL RELEASE    . CERVICAL FUSION    . CHOLECYSTECTOMY    . EYE SURGERY Bilateral    /w IOL  . HERNIA REPAIR     inguinal  .  JOINT REPLACEMENT Bilateral    R partial knee replacement, L knee complete replacement, with drill bit left in tibia from replacement surgery  . LUMBAR LAMINECTOMY    . REPLACEMENT TOTAL KNEE Bilateral   . RIGHT/LEFT HEART CATH AND CORONARY ANGIOGRAPHY N/A 08/11/2017   Procedure: RIGHT/LEFT HEART CATH AND CORONARY ANGIOGRAPHY;  Surgeon: Tonny Bollman, MD;  Location: [facility] INVASIVE CV LAB;  Service: Cardiovascular;  Laterality: N/A;  . TEE WITHOUT CARDIOVERSION N/A 08/25/2017   Procedure: TRANSESOPHAGEAL ECHOCARDIOGRAM (TEE);  Surgeon: Tonny Bollman, MD;  Location: [facility] OR;  Service: Open Heart Surgery;  Laterality: N/A;  . TONSILLECTOMY    . TRANSCATHETER AORTIC VALVE REPLACEMENT, TRANSFEMORAL N/A 08/25/2017   Procedure: TRANSCATHETER AORTIC VALVE REPLACEMENT, TRANSFEMORAL;  Surgeon: Tonny Bollman, MD;  Location: [facility] OR;  Service: Open Heart Surgery;  Laterality: N/A;    Current Medications: Outpatient Medications Prior to Visit  Medication Sig Dispense Refill  . acetaminophen (TYLENOL 8 HOUR ARTHRITIS PAIN) 650 MG CR tablet Take 650 mg by mouth every 8 (eight) hours as needed for  pain.    . amLODipine (NORVASC) 5 MG tablet TAKE 1 TABLET BY MOUTH ONCE DAILY 90 tablet 3  . apixaban (ELIQUIS) 5 MG TABS tablet Take 5 mg by mouth 2 (two) times daily.    Marland Kitchen atorvastatin (LIPITOR) 20 MG tablet Take 20 mg by mouth daily.     . carvedilol (COREG) 25 MG tablet Take 25 mg by mouth daily.     . Cholecalciferol (VITAMIN D3) 1000 units CAPS Take 1,000 Units by mouth daily.     . Cinnamon 500 MG capsule Take 500 mg by mouth 2 (two) times daily.    Marland Kitchen dronedarone (MULTAQ) 400 MG tablet Take 400 mg by mouth 2 (two) times daily with a meal.    . famotidine (PEPCID) 20 MG tablet Take 20 mg by mouth at bedtime.     . folic acid (FOLVITE) 400 MCG tablet Take 400 mcg by mouth daily.    . fosinopril (MONOPRIL) 20 MG tablet Take 20 mg by mouth at bedtime.     Marland Kitchen glimepiride (AMARYL) 1 MG tablet Take 1 mg by mouth  daily with breakfast.   0  . levothyroxine (SYNTHROID, LEVOTHROID) 75 MCG tablet Take 75 mcg by mouth daily before breakfast.     . Multiple Vitamin (MULTIVITAMIN WITH MINERALS) TABS tablet Take 1 tablet by mouth daily.    . Omega-3 Fatty Acids (FISH OIL) 1000 MG CAPS Take 1,000 mg by mouth 2 (two) times daily.     . vitamin C (ASCORBIC ACID) 500 MG tablet Take 500 mg by mouth 2 (two) times daily.     No facility-administered medications prior to visit.      Allergies:   Cinobac [cinoxacin]; Penicillins; and Sulfa antibiotics   Social History   Socioeconomic History  . Marital status: Single    Spouse name: Not on file  . Number of children: Not on file  . Years of education: Not on file  . Highest education level: Not on file  Occupational History  . Occupation: home maker  Social Needs  . Financial resource strain: Not on file  . Food insecurity:    Worry: Not on file    Inability: Not on file  . Transportation needs:    Medical: Not on file    Non-medical: Not on file  Tobacco Use  . Smoking status: Former Smoker    Last attempt to quit: 08/05/1958    Years since quitting: 59.9  . Smokeless tobacco: Never Used  Substance and Sexual Activity  . Alcohol use: No    Frequency: Never  . Drug use: No  . Sexual activity: Not on file  Lifestyle  . Physical activity:    Days per week: Not on file    Minutes per session: Not on file  . Stress: Not on file  Relationships  . Social connections:    Talks on phone: Not on file    Gets together: Not on file    Attends religious service: Not on file    Active member of club or organization: Not on file    Attends meetings of clubs or organizations: Not on file    Relationship status: Not on file  Other Topics Concern  . Not on file  Social History Narrative  . Not on file     Family History:  The patient's family history includes Breast cancer in her sister; CVA in her mother; Heart attack in her father.      ROS:  Please see the history of present illness.    ROS All other systems reviewed and are negative.   PHYSICAL EXAM:   VS:  BP 132/66   Pulse 64   Ht 5\' 5"NPONAVUUvO$  (1.651 m)   Wt 201 lb 9.6 oz (91.4 kg)   SpO2 97%   BMI 33.55 kg/m    GEN: Well nourished, well developed, in no acute distress, obese HEENT: normal Neck: no JVD or masses Cardiac: RRR; soft flow murmur. No rubs, or gallops,no edema  Respiratory:  clear to auscultation bilaterally, normal work of breathing GI: soft, nontender, nondistended, + BS MS: no deformity or atrophy Skin: warm and dry, no rash Neuro:  Alert and Oriented x 3, Strength and sensation are intact Psych: euthymic mood, full affect   Wt Readings from Last 3 Encounters:  07/07/18 201 lb 9.6 oz (91.4 kg)  09/04/17 203 lb (92.1 kg)  08/26/17 197 lb 1.5 oz (89.4 kg)      Studies/Labs Reviewed:   EKG:  EKG is NOT ordered today.    Recent Labs: 08/21/2017: ALT 18 08/25/2017: B Natriuretic Peptide 187.2 09/04/2017: BUN 17; Creatinine, Ser 0.92; Magnesium 1.8; Potassium 4.1; Sodium 140 09/23/2017: Hemoglobin 9.5; Platelets 168   Lipid Panel No results found for: CHOL, TRIG, HDL, CHOLHDL, VLDL, LDLCALC, LDLDIRECT  Additional studies/ records that were reviewed today include:  TAVR: 08/25/17  Transcatheter Aortic Valve Replacement - Percutaneous Transfemoral Approach Medtronic Evolut ProTHV (size8626mm, serial # [id number])  Co-Surgeons:Bryan Jennefer BravoK. Bartle, MD and Tonny BollmanMichael Cooper, MD  Anesthesiologist:Kevin Hart RochesterHollis, MD  Echocardiographer:Peter Eden EmmsNishan, MD  Pre-operative Echo Findings: ? Severe aortic stenosis ? Normalleft ventricular systolic function  Post-operative Echo Findings: ? Noparavalvular leak ? Unchangedleft ventricular systolic function  ____________  TTE: 09/23/17 Study Conclusions - Left ventricle: The cavity size was normal. Wall thickness was   increased in a  pattern of mild LVH. Systolic function was normal.   The estimated ejection fraction was in the range of 60% to 65%.   Wall motion was normal; there were no regional wall motion   abnormalities. Doppler parameters are consistent with abnormal   left ventricular relaxation (grade 1 diastolic dysfunction). - Aortic valve: s/p Medtronic Evolut Pro THV TAVR. No obstruction.   There was trivial paravalvular leak. Mean gradient (S): 9 mm Hg.   Peak gradient (S): 16 mm Hg. Valve area (VTI): 1.92 cm^2. Valve   area (Vmax): 1.84 cm^2. Valve area (Vmean): 1.87 cm^2. - Mitral valve: Calcified annulus. Moderately thickened leaflets .   Mild to moderate regurgitation. Valve area by continuity equation   (using LVOT flow): 2.05 cm^2. - Left atrium: Severely dilated. - Right atrium: The atrium was normal in size. - Tricuspid valve: There was mild regurgitation. - Pulmonary arteries: PA peak pressure: 42 mm Hg (S). - Inferior vena cava: The vessel was normal in size. The   respirophasic diameter changes were in the normal range (>= 50%),   consistent with normal central venous pressure. Impressions: - Compared to a prior study in 07/2017, the paravalvular leak is now   trivial. The TAVR valve appears normal without obstruction.  ________________  Echo 07/07/18 Study Conclusions  - Left ventricle: The cavity size was normal. Systolic function was   normal. The estimated ejection fraction was in the range of 55%   to 60%. Wall motion was normal; there were no regional wall   motion abnormalities. Doppler parameters are consistent with   abnormal left ventricular relaxation (grade 1 diastolic   dysfunction). The calculated 3D  left ventricular ejection   fraction was 61 % - Aortic valve: A prosthesis was present and functioning normally.   The prosthesis had a normal range of motion. The sewing ring   appeared normal, had no rocking motion, and showed no evidence of   dehiscence. There was mild  stenosis. Mean gradient (S): 17 mm Hg.   Peak gradient (S): 34 mm Hg. - Aorta: Ascending aortic diameter: 39 mm (S). - Ascending aorta: The ascending aorta was mildly dilated. - Mitral valve: Calcified annulus. Moderately thickened leaflets .   There was mild to moderate regurgitation. - Left atrium: The atrium was moderately to severely dilated. - Right atrium: The atrium was mildly dilated. - Pulmonary arteries: PA peak pressure: 32 mm Hg (S). Impressions: - TAVR appears well seated and stable. Compared to most recent   study, gradients are more elevated (mean 9 -> 17, peak 16 -> 34).    ASSESSMENT & PLAN:   Severe AS s/p TAVR: echo today shows EF 60%, normally functioning TAVR with increased gradients from previous (compared to most recent study, gradients are more elevated (mean 9 -> 17, peak 16 -> 34) but still in an acceptable range). She has NYHA class I symptoms, but not very physically active due to orthopedic issues. SBE prophylaxis discussed. 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 Recommend annual imaging followup by CTA or MRA, due 07/2018. She would like this to be followed by [place] in [place], although not sure how aggressive we would be with this given advanced age.  We will have the records faxed.  Medication Adjustments/Labs and Tests Ordered: Current medicines are reviewed at length with the patient today.  Concerns regarding medicines are outlined above.  Medication changes, Labs and Tests ordered today are listed in the Patient Instructions below. Patient Instructions  Medication Instructions:  Your provider recommends that you continue on your current medications as directed. Please refer to the Current Medication list given to you today.     Labwork: None  Testing/Procedures: None  Follow-Up: Please call us if you need anything!    Signed, Cline CrockKathryn Carley, PA-C  07/08/2018 9:01 AM    [facility] [address], [place], [place]  [address] Phone: [phone]) [phone]; Fax: [phone]

## 2018-07-07 ENCOUNTER — Ambulatory Visit (INDEPENDENT_AMBULATORY_CARE_PROVIDER_SITE_OTHER): Payer: Medicare Other | Admitting: Physician Assistant

## 2018-07-07 ENCOUNTER — Other Ambulatory Visit: Payer: Self-pay

## 2018-07-07 ENCOUNTER — Ambulatory Visit (HOSPITAL_COMMUNITY): Payer: Medicare Other | Attending: Cardiology

## 2018-07-07 ENCOUNTER — Encounter: Payer: Self-pay | Admitting: Physician Assistant

## 2018-07-07 VITALS — BP 132/66 | HR 64 | Ht 65.0 in | Wt 201.6 lb

## 2018-07-07 DIAGNOSIS — E785 Hyperlipidemia, unspecified: Secondary | ICD-10-CM

## 2018-07-07 DIAGNOSIS — I480 Paroxysmal atrial fibrillation: Secondary | ICD-10-CM | POA: Diagnosis not present

## 2018-07-07 DIAGNOSIS — Z952 Presence of prosthetic heart valve: Secondary | ICD-10-CM

## 2018-07-07 DIAGNOSIS — I10 Essential (primary) hypertension: Secondary | ICD-10-CM | POA: Diagnosis not present

## 2018-07-07 DIAGNOSIS — I716 Thoracoabdominal aortic aneurysm, without rupture, unspecified: Secondary | ICD-10-CM

## 2018-07-07 DIAGNOSIS — I350 Nonrheumatic aortic (valve) stenosis: Secondary | ICD-10-CM

## 2018-07-07 NOTE — Patient Instructions (Addendum)
Medication Instructions:  Your provider recommends that you continue on your current medications as directed. Please refer to the Current Medication list given to you today.    Labwork: None  Testing/Procedures: None  Follow-Up: Please call us if you need anything!! 

## 2018-08-02 ENCOUNTER — Encounter: Payer: Self-pay | Admitting: Thoracic Surgery (Cardiothoracic Vascular Surgery)

## 2020-01-30 IMAGING — CR DG CHEST 2V
2 series · 2 of 2 positions shown · non-contrast
Comparison: None.

CLINICAL DATA: Preoperative respiratory exam for cardiac surgery.

EXAM:
CHEST  2 VIEW

[w chest pa]
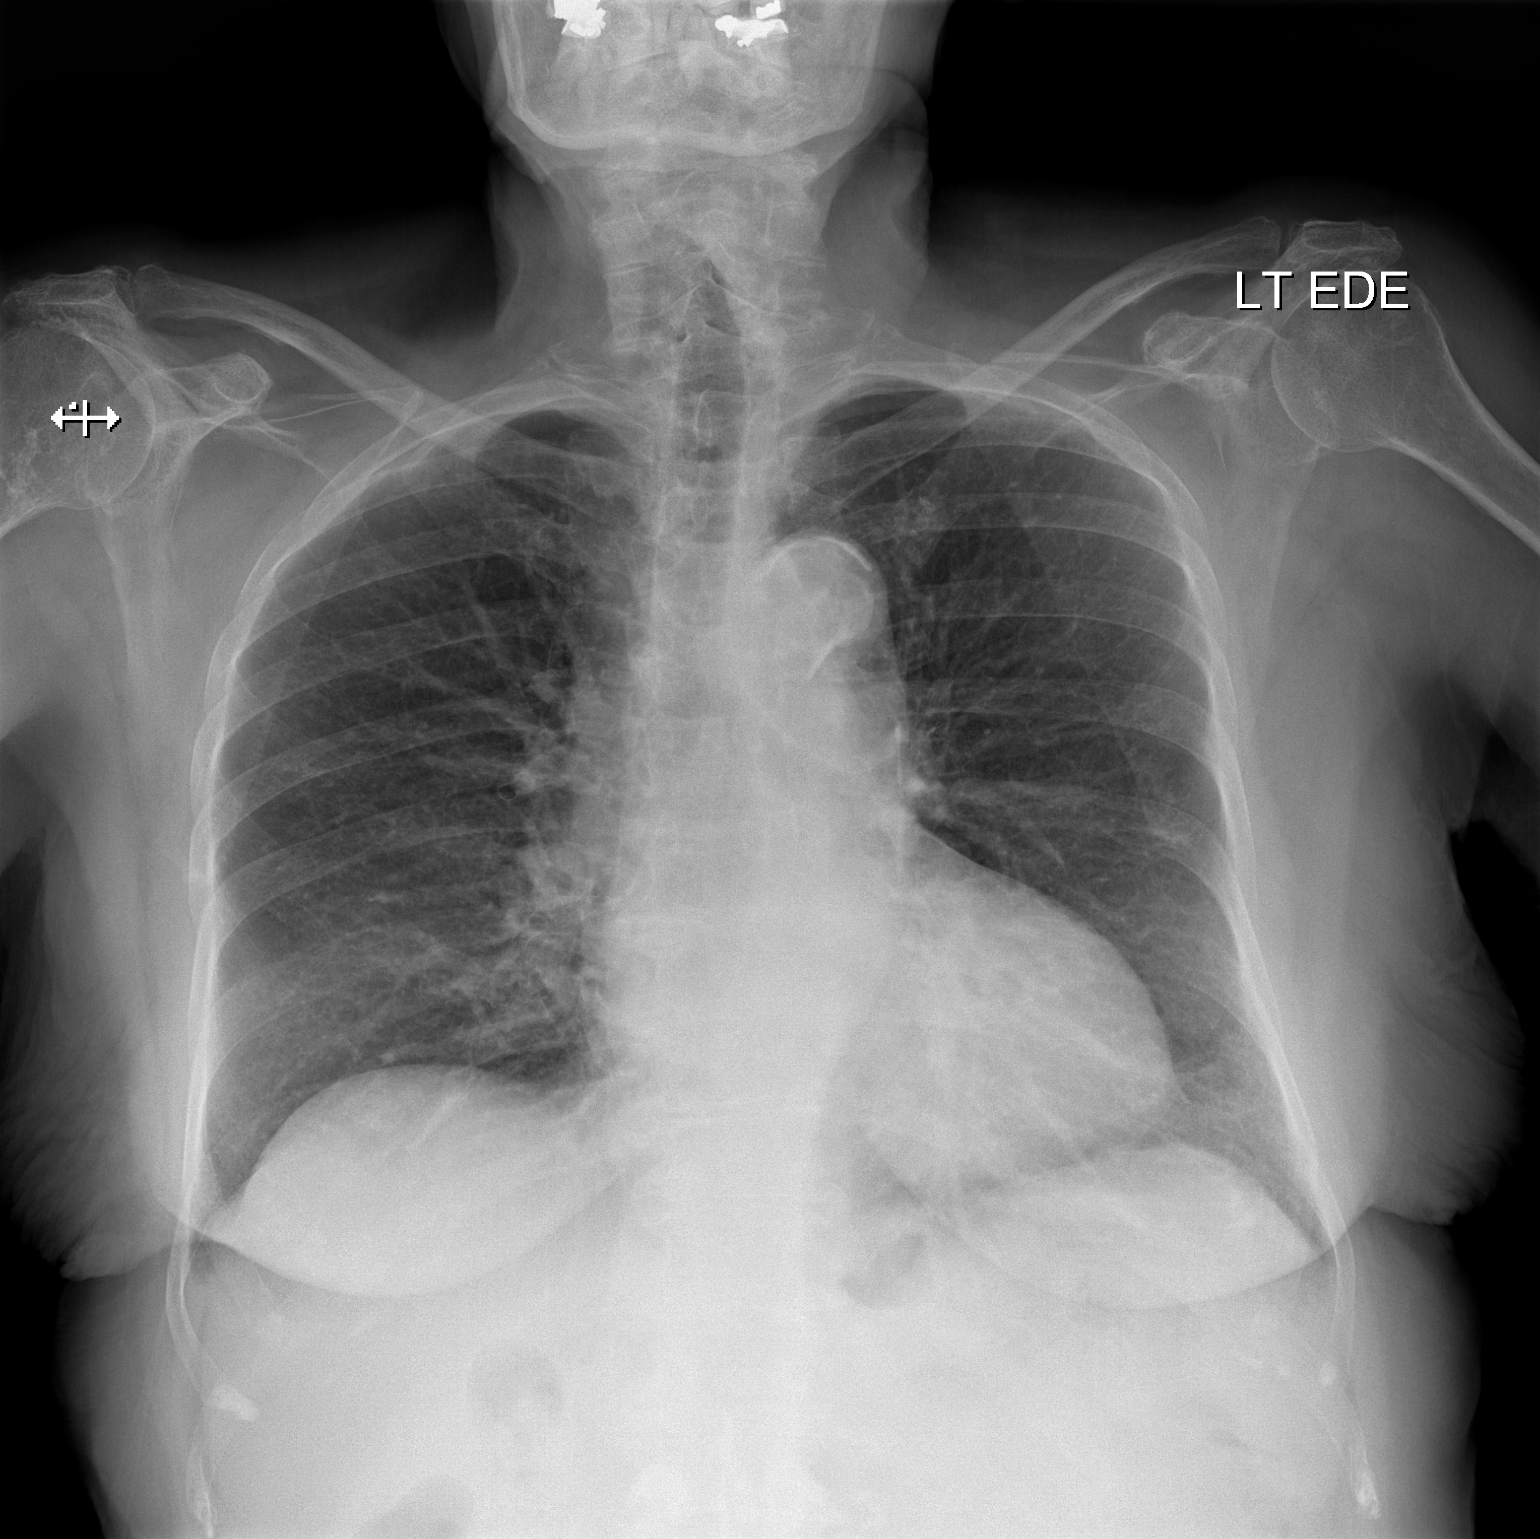

[w chest lat]
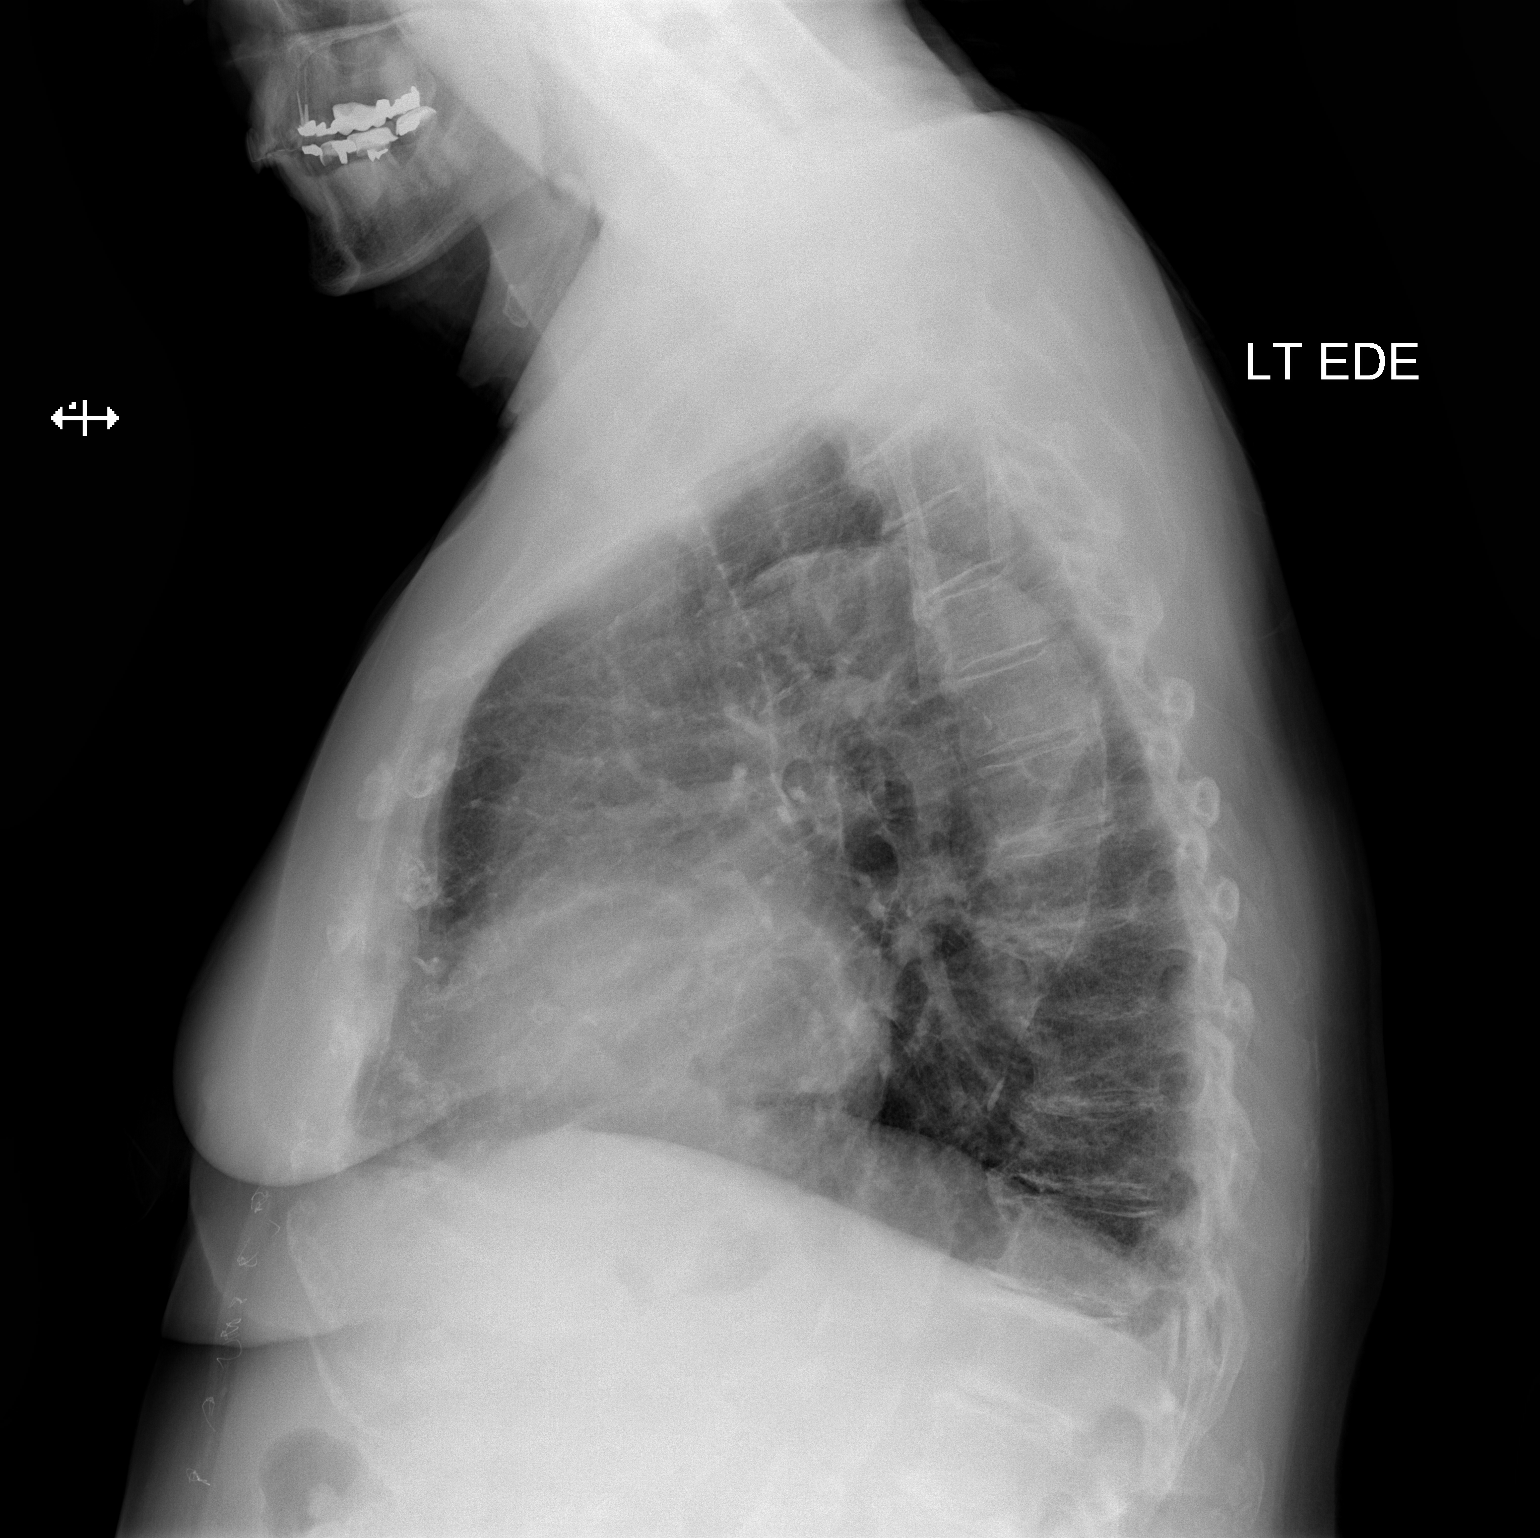

[2 of 2 positions shown; findings below may reference images not displayed]

FINDINGS: Left ventricular prominence. Aortic atherosclerosis and tortuosity.
The lungs are clear. The pulmonary vascularity is normal. No
effusions. Chronic degenerative changes affect the spine.
IMPRESSION: No active process. Left ventricular prominence and aortic
atherosclerosis.

## 2020-02-03 IMAGING — DX DG CHEST 1V PORT
1 series · 1 of 1 positions shown · non-contrast
Comparison: August 21, 2017

CLINICAL DATA: Status post aortic valve replacement

EXAM:
PORTABLE CHEST 1 VIEW

[chest ap]
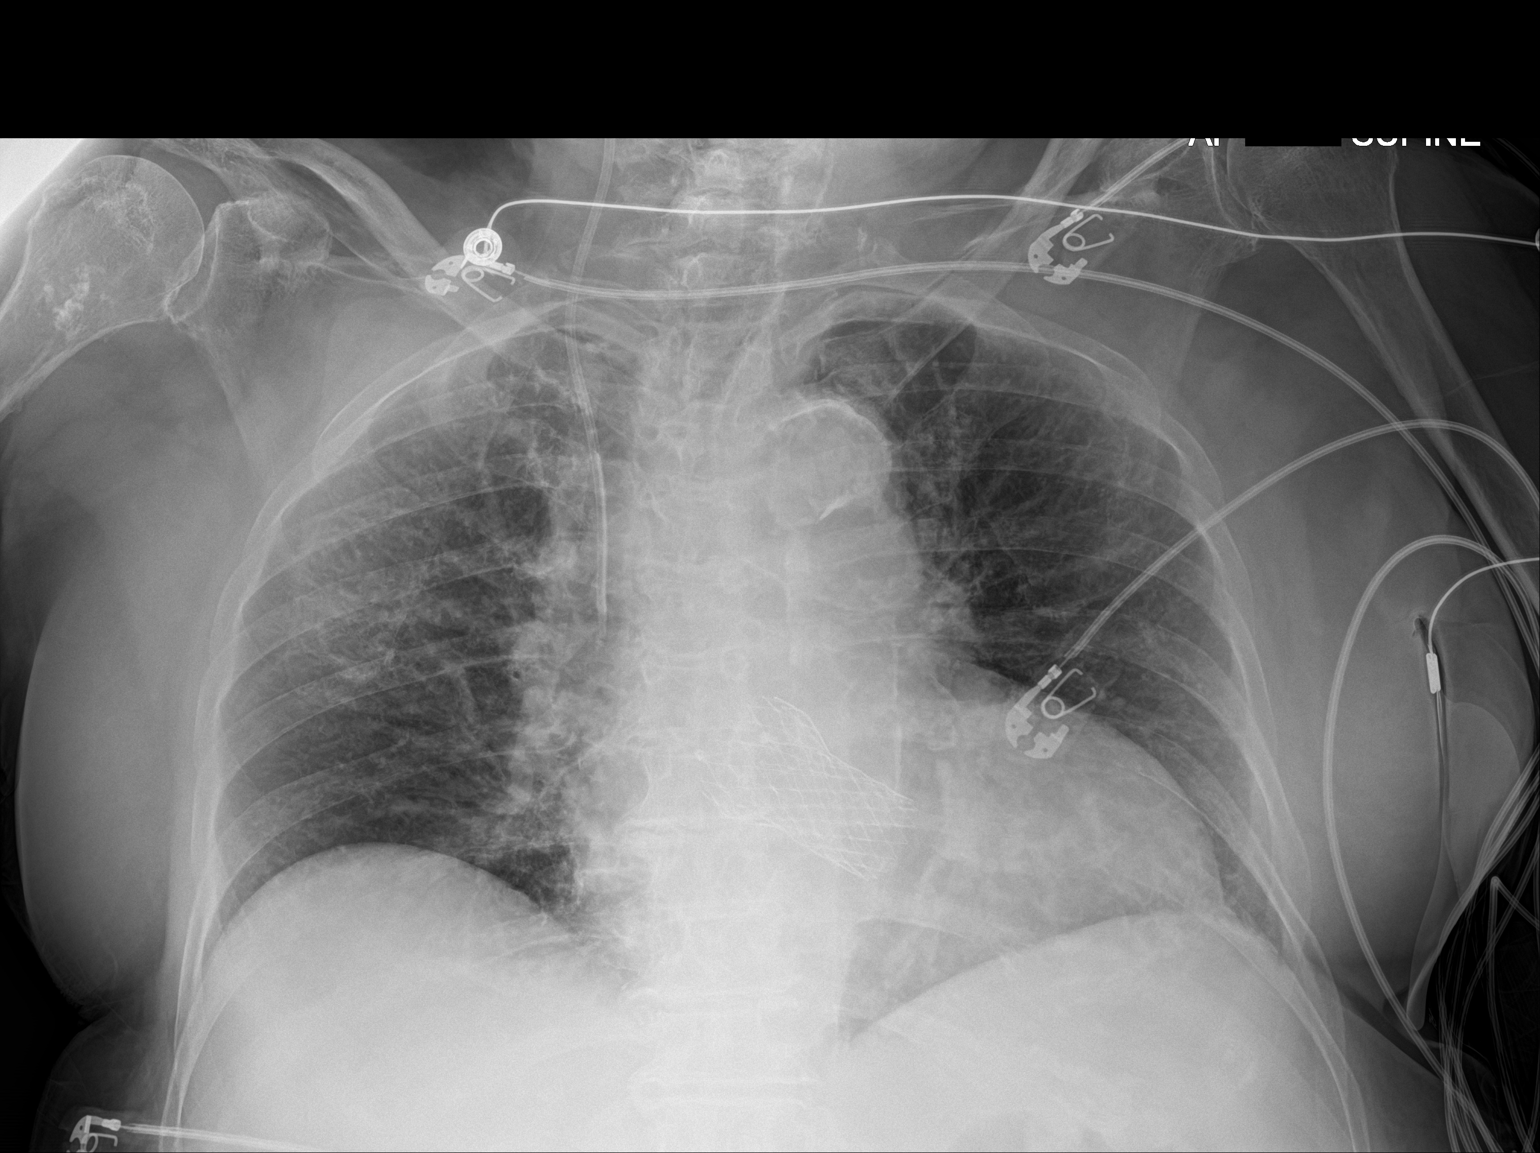

[1 of 1 positions shown; findings below may reference images not displayed]

FINDINGS: Central catheter tip is in the superior vena cava. No pneumothorax.
There is atelectatic change in both upper lobes, slightly more on
the right than on the left. There is no consolidation. Heart is
mildly enlarged with pulmonary vascularity within normal limits.
Transcatheter placed aortic valve replacement noted. There is aortic
atherosclerosis. No adenopathy. There is a sclerotic area in the
proximal right humerus, unchanged.
IMPRESSION: Transcatheter placed aortic valve replacement noted. Areas of upper
lobe atelectatic change. No edema or consolidation. Heart mildly
enlarged. There is aortic atherosclerosis.. Sclerotic area in
proximal right humerus consistent with either infarct or
enchondroma.

Aortic Atherosclerosis ([id number]).

## 2020-02-04 IMAGING — DX DG CHEST 1V PORT
1 series · 1 of 1 positions shown · non-contrast
Comparison: 08/25/2017

CLINICAL DATA: Reason for exam: S/P TAVR Patient alert and talking,
states she is in no pain from Surgery, no SOB. A headache is her
only complaint. Hx of aortic valve stenosis, CAD, Diabetic, heart
murmur, HTN, hypertensive heart disease without heart failure.

EXAM:
PORTABLE CHEST 1 VIEW

[chest]
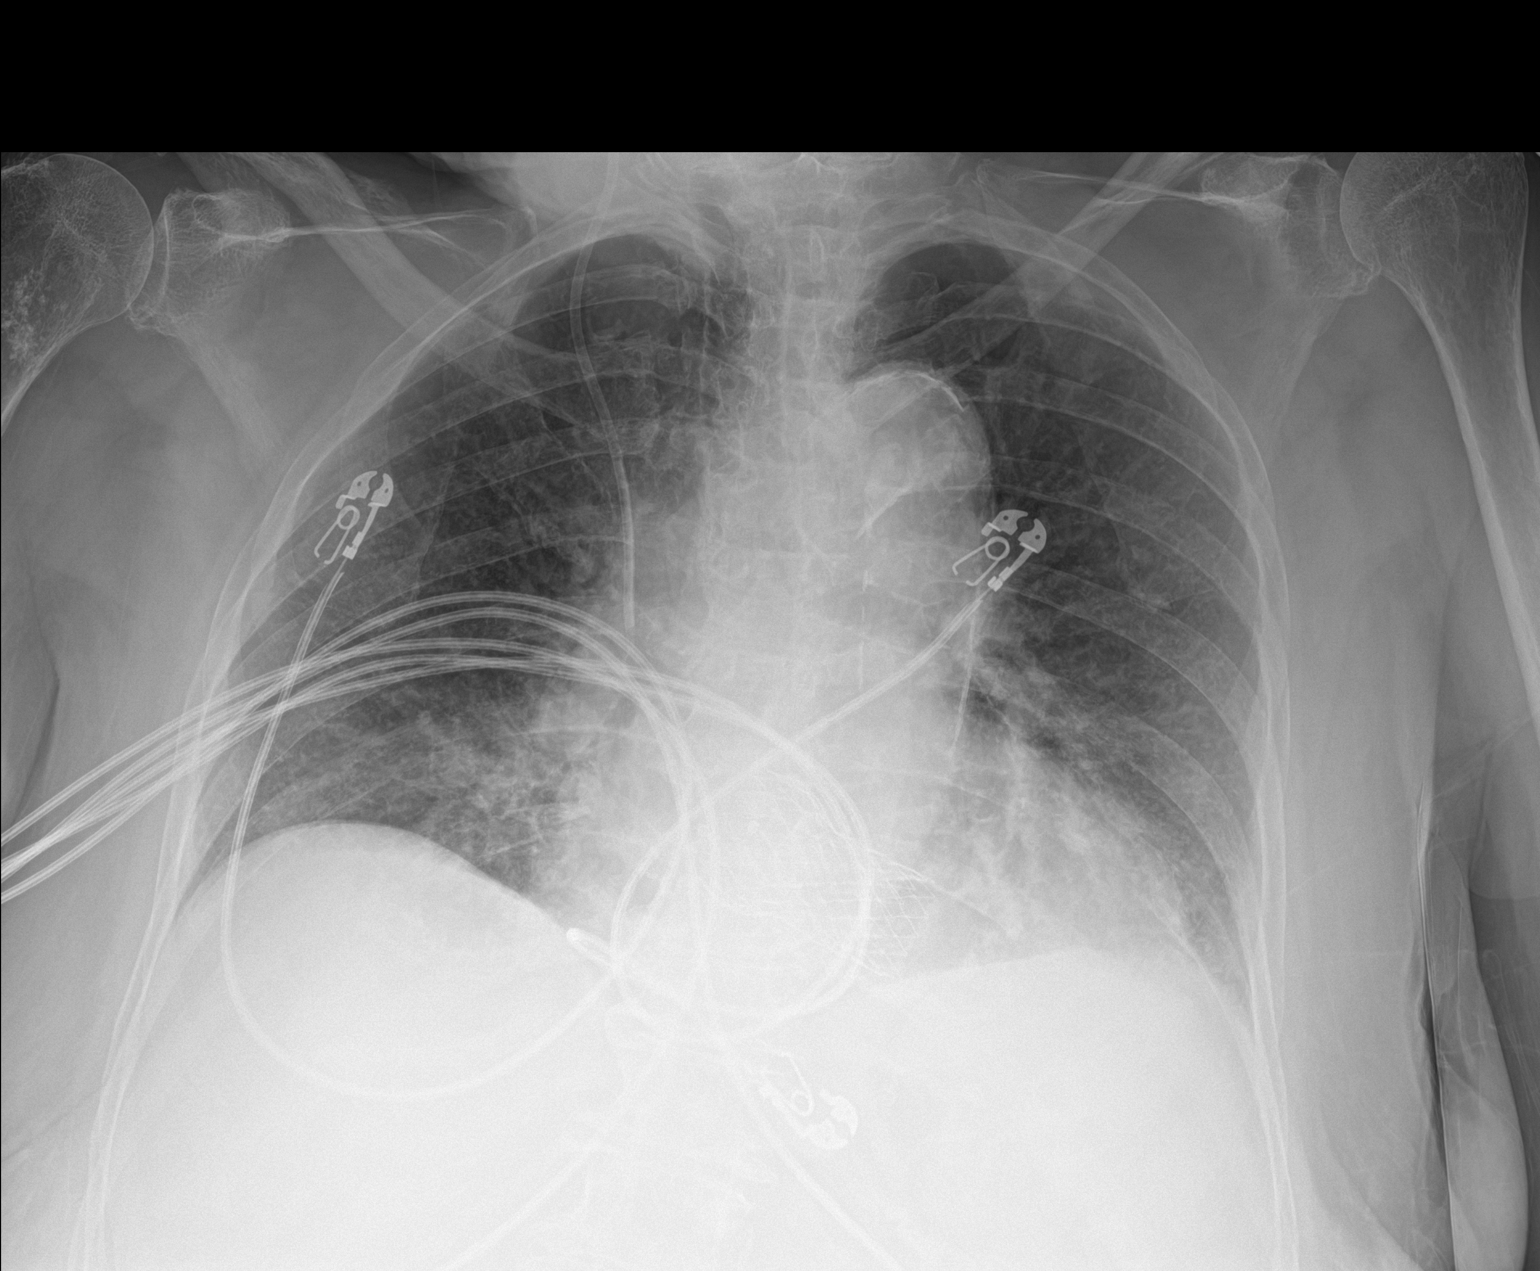

[1 of 1 positions shown; findings below may reference images not displayed]

FINDINGS: RIGHT IJ central line tip overlies the level of superior vena cava.
Patient is slightly rotated towards the RIGHT. Heart is enlarged.
TAVR. There is dense atherosclerotic calcification of the thoracic
aorta. Fine reticular changes are identified throughout the lungs,
consistent mild edema. No overt alveolar edema or consolidation.
IMPRESSION: Mild interstitial edema.

Postoperative changes.
